# Patient Record
Sex: Female | Born: 1995 | Race: Black or African American | Hispanic: No | Marital: Married | Smoking: Never smoker
Health system: Southern US, Community
[De-identification: ages and names within clinical notes are randomized; demographics above are authoritative.]

## PROBLEM LIST (undated history)

## (undated) ENCOUNTER — Inpatient Hospital Stay (HOSPITAL_COMMUNITY): Payer: Self-pay

## (undated) DIAGNOSIS — K559 Vascular disorder of intestine, unspecified: Secondary | ICD-10-CM

## (undated) DIAGNOSIS — Z789 Other specified health status: Secondary | ICD-10-CM

## (undated) HISTORY — DX: Other specified health status: Z78.9

## (undated) HISTORY — DX: Vascular disorder of intestine, unspecified: K55.9

## (undated) HISTORY — PX: NO PAST SURGERIES: SHX2092

---

## 2015-12-11 NOTE — L&D Delivery Note (Signed)
Delivery Note At 9:10 PM a viable female was delivered via Vaginal, Spontaneous Delivery (Presentation: OA ).  APGAR:8,9 ;  Placenta status: delivered spontaneously intact with fundal massage and mild traction. Cord: 3 vessel, nuchal x1   Anesthesia:  Nubain 10 mg & promethazine 12.5mg  Episiotomy: None Lacerations: None Suture Repair: NA Est. Blood Loss (mL):  200  Mom to postpartum.  Baby to Couplet care / Skin to Skin. Al instruments and gauze accounted for.  Andres Egericia Jalaila Caradonna, D, PGY-1, MPH 07/15/2016, 9:25 PM

## 2016-02-02 LAB — OB RESULTS CONSOLE PLATELET COUNT: PLATELETS: 179 10*3/uL

## 2016-02-02 LAB — OB RESULTS CONSOLE HGB/HCT, BLOOD
HEMATOCRIT: 36 %
HEMOGLOBIN: 11.8 g/dL

## 2016-02-02 LAB — OB RESULTS CONSOLE TSH: TSH: 0.645

## 2016-02-02 LAB — OB RESULTS CONSOLE RUBELLA ANTIBODY, IGM: Rubella: IMMUNE

## 2016-02-02 LAB — OB RESULTS CONSOLE RPR: RPR: NONREACTIVE

## 2016-02-02 LAB — OB RESULTS CONSOLE HEPATITIS B SURFACE ANTIGEN: Hepatitis B Surface Ag: NEGATIVE

## 2016-02-02 LAB — OB RESULTS CONSOLE HIV ANTIBODY (ROUTINE TESTING): HIV: NONREACTIVE

## 2016-03-26 LAB — OB RESULTS CONSOLE HGB/HCT, BLOOD
HEMATOCRIT: 31 %
HEMOGLOBIN: 10.5 g/dL

## 2016-03-26 LAB — OB RESULTS CONSOLE PLATELET COUNT: PLATELETS: 157 10*3/uL

## 2016-03-29 LAB — OB RESULTS CONSOLE ABO/RH: RH Type: POSITIVE

## 2016-05-15 ENCOUNTER — Encounter: Payer: Self-pay | Admitting: *Deleted

## 2016-05-16 ENCOUNTER — Encounter: Payer: Self-pay | Admitting: Obstetrics

## 2016-05-16 ENCOUNTER — Ambulatory Visit (INDEPENDENT_AMBULATORY_CARE_PROVIDER_SITE_OTHER): Payer: Medicaid Other | Admitting: Obstetrics

## 2016-05-16 VITALS — BP 96/60 | HR 85 | Wt 176.0 lb

## 2016-05-16 DIAGNOSIS — K219 Gastro-esophageal reflux disease without esophagitis: Secondary | ICD-10-CM

## 2016-05-16 DIAGNOSIS — Z3493 Encounter for supervision of normal pregnancy, unspecified, third trimester: Secondary | ICD-10-CM | POA: Diagnosis not present

## 2016-05-16 LAB — POCT URINALYSIS DIPSTICK
BILIRUBIN UA: NEGATIVE
Blood, UA: NEGATIVE
GLUCOSE UA: NEGATIVE
KETONES UA: NEGATIVE
Nitrite, UA: NEGATIVE
PROTEIN UA: NEGATIVE
SPEC GRAV UA: 1.01
Urobilinogen, UA: NEGATIVE
pH, UA: 7.5

## 2016-05-16 MED ORDER — VITAFOL FE+ 90-1-200 & 50 MG PO CPPK: 1.0000 | ORAL_CAPSULE | Freq: Every day | ORAL | Status: DC

## 2016-05-16 MED ORDER — OMEPRAZOLE 20 MG PO CPDR: 20.0000 mg | DELAYED_RELEASE_CAPSULE | Freq: Two times a day (BID) | ORAL | Status: DC

## 2016-05-16 NOTE — Progress Notes (Signed)
Subjective:    Faith Woods is being seen today for her first obstetrical visit.  This is a planned pregnancy. She is at 5172w1d gestation. Her obstetrical history is significant for H/O preterm delivery at ~ 28 weeks in [place]. Relationship with FOB: significant other. Patient does intend to breast feed. Pregnancy history fully reviewed.  The information documented in the HPI was reviewed and verified.  Menstrual History: OB History    Gravida Para Term Preterm AB TAB SAB Ectopic Multiple Living   2 1  1      1        Patient's last menstrual period was 10/11/2015 (exact date).    Past Medical History  Diagnosis Date  . Ischemic bowel syndrome (HCC)     No past surgical history on file.   (Not in a hospital admission) Not on File  Social History  Substance Use Topics  . Smoking status: Not on file  . Smokeless tobacco: Not on file  . Alcohol Use: Not on file    No family history on file.   Review of Systems Constitutional: negative for weight loss Gastrointestinal: negative for vomiting Genitourinary:negative for genital lesions and vaginal discharge and dysuria Musculoskeletal:negative for back pain Behavioral/Psych: negative for abusive relationship, depression, illegal drug usage and tobacco use    Objective:    BP 96/60 mmHg  Pulse 85  Wt 176 lb (79.833 kg)  LMP 10/11/2015 (Exact Date) General Appearance:    Alert, cooperative, no distress, appears stated age  Head:    Normocephalic, without obvious abnormality, atraumatic  Eyes:    PERRL, conjunctiva/corneas clear, EOM's intact, fundi    benign, both eyes  Ears:    Normal TM's and external ear canals, both ears  Nose:   Nares normal, septum midline, mucosa normal, no drainage    or sinus tenderness  Throat:   Lips, mucosa, and tongue normal; teeth and gums normal  Neck:   Supple, symmetrical, trachea midline, no adenopathy;    thyroid:  no enlargement/tenderness/nodules; no carotid   bruit or JVD  Back:      Symmetric, no curvature, ROM normal, no CVA tenderness  Lungs:     Clear to auscultation bilaterally, respirations unlabored  Chest Wall:    No tenderness or deformity   Heart:    Regular rate and rhythm, S1 and S2 normal, no murmur, rub   or gallop  Breast Exam:    No tenderness, masses, or nipple abnormality  Abdomen:     Soft, non-tender, bowel sounds active all four quadrants,    no masses, no organomegaly  Genitalia:    Normal female without lesion, discharge or tenderness  Extremities:   Extremities normal, atraumatic, no cyanosis or edema  Pulses:   2+ and symmetric all extremities  Skin:   Skin color, texture, turgor normal, no rashes or lesions  Lymph nodes:   Cervical, supraclavicular, and axillary nodes normal  Neurologic:   CNII-XII intact, normal strength, sensation and reflexes    throughout      Lab Review Urine pregnancy test Labs reviewed no Radiologic studies reviewed no Assessment:    Pregnancy at 5372w1d weeks    H/O Preterm Delivery at ~ 3528 Weeks in [place].   Plan:      Prenatal vitamins.  Counseling provided regarding continued use of seat belts, cessation of alcohol consumption, smoking or use of illicit drugs; infection precautions i.e., influenza/TDAP immunizations, toxoplasmosis,CMV, parvovirus, listeria and varicella; workplace safety, exercise during pregnancy; routine dental care, safe medications,  sexual activity, hot tubs, saunas, pools, travel, caffeine use, fish and methlymercury, potential toxins, hair treatments, varicose veins Weight gain recommendations per IOM guidelines reviewed: underweight/BMI< 18.5--> gain 28 - 40 lbs; normal weight/BMI 18.5 - 24.9--> gain 25 - 35 lbs; overweight/BMI 25 - 29.9--> gain 15 - 25 lbs; obese/BMI >30->gain  11 - 20 lbs Problem list reviewed and updated. FIRST/CF mutation testing/NIPT/QUAD SCREEN/fragile X/Ashkenazi Jewish population testing/Spinal muscular atrophy discussed: too late. Role of ultrasound in pregnancy  discussed; fetal survey: Yes. Amniocentesis discussed: not indicated. VBAC calculator score: VBAC consent form provided Meds ordered this encounter  Medications  . Prenat-FePoly-Metf-FA-DHA-DSS (VITAFOL FE+) 90-1-200 & 50 MG CPPK    Sig: Take 1 tablet by mouth daily before breakfast.    Dispense:  90 each    Refill:  3  . omeprazole (PRILOSEC) 20 MG capsule    Sig: Take 1 capsule (20 mg total) by mouth 2 (two) times daily before a meal.    Dispense:  60 capsule    Refill:  5   Orders Placed This Encounter  Procedures  . [place] M[facility] OB DETAIL +14 WK    Standing Status: Future     Number of Occurrences:      Standing Expiration Date: 07/16/2017    Order Specific Question:  Reason for Exam (SYMPTOM  OR DIAGNOSIS REQUIRED)    Answer:  H/O preterm delivery at 28 weeks.    Order Specific Question:  Preferred imaging location?    Answer:  [facility]Ultrasound  . [place] [facility] OB Transvaginal    Standing Status: Future     Number of Occurrences:      Standing Expiration Date: 07/16/2017    Order Specific Question:  Reason for Exam (SYMPTOM  OR DIAGNOSIS REQUIRED)    Answer:  H/O preterm delivery at 28 weeks.    Order Specific Question:  Preferred imaging location?    Answer:  [facility]Ultrasound  . AMB referral to maternal fetal medicine    Referral Priority:  Routine    Referral Type:  Consultation    Referral Reason:  Specialty Services Required    Number of Visits Requested:  1  . POCT urinalysis dipstick    Follow up in 2 weeks.

## 2016-05-21 ENCOUNTER — Other Ambulatory Visit: Payer: Self-pay | Admitting: Obstetrics

## 2016-05-21 ENCOUNTER — Encounter (HOSPITAL_COMMUNITY): Payer: Self-pay

## 2016-05-21 ENCOUNTER — Ambulatory Visit (HOSPITAL_COMMUNITY)
Admission: RE | Admit: 2016-05-21 | Discharge: 2016-05-21 | Disposition: A | Discharge status: Home / Self Care | Payer: Medicaid Other | Source: Ambulatory Visit | Attending: Obstetrics | Admitting: Obstetrics

## 2016-05-21 DIAGNOSIS — O09893 Supervision of other high risk pregnancies, third trimester: Secondary | ICD-10-CM

## 2016-05-21 DIAGNOSIS — O09213 Supervision of pregnancy with history of pre-term labor, third trimester: Secondary | ICD-10-CM | POA: Diagnosis not present

## 2016-05-21 DIAGNOSIS — Z3493 Encounter for supervision of normal pregnancy, unspecified, third trimester: Secondary | ICD-10-CM

## 2016-05-21 DIAGNOSIS — Z3689 Encounter for other specified antenatal screening: Secondary | ICD-10-CM

## 2016-05-21 DIAGNOSIS — Z3A31 31 weeks gestation of pregnancy: Secondary | ICD-10-CM

## 2016-05-21 DIAGNOSIS — Z36 Encounter for antenatal screening of mother: Secondary | ICD-10-CM | POA: Diagnosis not present

## 2016-05-21 LAB — NUSWAB VG+, CANDIDA 6SP
Atopobium vaginae: HIGH Score — AB
CANDIDA KRUSEI, NAA: NEGATIVE
CHLAMYDIA TRACHOMATIS, NAA: NEGATIVE
Candida albicans, NAA: NEGATIVE
Candida glabrata, NAA: NEGATIVE
Candida lusitaniae, NAA: NEGATIVE
Candida parapsilosis, NAA: NEGATIVE
Candida tropicalis, NAA: NEGATIVE
MEGASPHAERA 1: HIGH {score} — AB
NEISSERIA GONORRHOEAE, NAA: NEGATIVE
TRICH VAG BY NAA: NEGATIVE

## 2016-05-22 ENCOUNTER — Other Ambulatory Visit: Payer: Self-pay | Admitting: Obstetrics

## 2016-05-22 DIAGNOSIS — B9689 Other specified bacterial agents as the cause of diseases classified elsewhere: Secondary | ICD-10-CM

## 2016-05-22 DIAGNOSIS — N760 Acute vaginitis: Principal | ICD-10-CM

## 2016-05-22 MED ORDER — METRONIDAZOLE 500 MG PO TABS: 500.0000 mg | ORAL_TABLET | Freq: Two times a day (BID) | ORAL | Status: DC

## 2016-05-24 ENCOUNTER — Encounter: Payer: Self-pay | Admitting: *Deleted

## 2016-05-25 ENCOUNTER — Encounter (HOSPITAL_COMMUNITY): Payer: Self-pay

## 2016-05-25 ENCOUNTER — Ambulatory Visit (HOSPITAL_COMMUNITY)
Admission: RE | Admit: 2016-05-25 | Discharge: 2016-05-25 | Disposition: A | Discharge status: Home / Self Care | Payer: Medicaid Other | Source: Ambulatory Visit | Attending: Obstetrics | Admitting: Obstetrics

## 2016-05-25 VITALS — BP 110/69 | HR 89 | Wt 178.8 lb

## 2016-05-25 DIAGNOSIS — O09213 Supervision of pregnancy with history of pre-term labor, third trimester: Secondary | ICD-10-CM | POA: Diagnosis present

## 2016-05-25 DIAGNOSIS — Z3A32 32 weeks gestation of pregnancy: Secondary | ICD-10-CM | POA: Diagnosis not present

## 2016-05-25 DIAGNOSIS — O09293 Supervision of pregnancy with other poor reproductive or obstetric history, third trimester: Secondary | ICD-10-CM

## 2016-05-25 NOTE — Consult Note (Signed)
MFM Consult, staff Note  I reviewed with the patient the pathophysiology of preterm delivery in pregnancy and that often the etiology of the preterm delivery is not ascertained from the clinical scenario. In this patient's case she has a history of 28 week pPROM followed by spontaneous preterm delivery.  ? Infection may be a cause of preterm labor and preterm delivery. Advanced cervical dilation can also lead to infection; therefore, there are 2 mechanisms that could be contributing to the preterm delivery. Nonetheless the recurrence for spontaneous preterm birth is 2 times higher than the general population in women who experience a prior preterm delivery and the actual risk can be in the range of 50% to 60%.  ? The risk of preterm delivery increases with each prior preterm birth. The most recent birth is the most predictive of risk for future preterm birth, which was demonstrated in the [place] study by Kerr-McGeeBakketeig et al in 1981. The risk increases further as the gestational age of the index preterm birth declines, especially with gestational age before 8032 weeks. If the 1st preterm birth occurred before 32 weeks the risk of recurrent preterm birth is 28%. The etiology of recurrent preterm delivery is often not clear but has been related to shortened cervix, infection and short inter-pregnancy interval.   Given that she is already [redacted] weeks gestation, the classic preventive and surveillance measures are not effective (eg, cervical length assessments prior to 28 weeks and starting weekly 17-P from 16-36 weeks).  I focused on giving her preterm labor precautions and advised her to seek early care in the first trimester for next pregnancy.  I did remind the patient that there is no treatment for preterm delivery, but there are ways to follow the next pregnancy in order to make appropriate interventions. The cervical length should be measured via transvaginal ultrasound at the anatomy scan and again at 22-23  6/7 weeks for risk assessment.  Follow up cervical length thereafter as clinically appropriate.  If at any point the cervix measures less than 25 mm, we would then recommend supplemental vaginal progesterone and more close surveillance depending on actual length. Again, this would be a discussion that would need to be undertaken at that time depending on the clinical scenario.  ? I also advised the patient undergo weekly intramuscular injections of 17-hydroxyprogesterone caproate (17-OHP) from [redacted] wks GA until 36 weeks' gestational age in the next pregnancy noting at 6758w3d she is too late for appreciable benefit in this gestation. Nonetheless, this is a once per week intramuscular injection which is supported by compelling scientific evidence to decrease the risk of recurrent preterm delivery in women with a prior history of preterm delivery. I did remind the patient that this is not a treatment for preterm delivery, but rather an intervention that may significantly reduce the recurrence of preterm delivery, especially early pPROM history as with this patient.   Summary of Recommendations:  1. Preterm labor precautions 2.  Endovaginal cervical length screening measurement at 18weeks and 22-23 6/7 weeks for risk assessment in future gestation.  3. For next pregnancy, I also advised the patient undergo weekly intramuscular injections of 17-hydroxyprogesterone caproate (17-OHP) from [redacted] wks GA until 36 weeks' gestational age.  Time Spent: I spent in excess of 30 minutes in consultation with this patient to review records, evaluate her case, and provide her with an adequate discussion and education. More than 50% of this time was spent in direct face-to-face counseling. ? It was a pleasure seeing your patient  in the office today. Thank you for consultation. Please do not hesitate to contact our service for any further questions.  ? Thank you, ? Louann Sjogren Denney  ?

## 2016-05-30 ENCOUNTER — Ambulatory Visit (INDEPENDENT_AMBULATORY_CARE_PROVIDER_SITE_OTHER): Payer: Medicaid Other | Admitting: Obstetrics

## 2016-05-30 VITALS — BP 99/59 | HR 96 | Temp 98.8°F | Wt 180.0 lb

## 2016-05-30 DIAGNOSIS — Z3493 Encounter for supervision of normal pregnancy, unspecified, third trimester: Secondary | ICD-10-CM

## 2016-05-30 LAB — POCT URINALYSIS DIPSTICK
BILIRUBIN UA: NEGATIVE
GLUCOSE UA: NEGATIVE
Ketones, UA: NEGATIVE
NITRITE UA: NEGATIVE
RBC UA: NEGATIVE
Spec Grav, UA: 1.01
Urobilinogen, UA: NEGATIVE
pH, UA: 7

## 2016-05-31 ENCOUNTER — Encounter: Payer: Self-pay | Admitting: Obstetrics

## 2016-05-31 NOTE — Progress Notes (Signed)
Subjective:    Faith Woods is a 20 y.o. female being seen today for her obstetrical visit. She is at 6927w2d gestation. Patient reports no complaints. Fetal movement: normal.  Problem List Items Addressed This Visit    None    Visit Diagnoses    Prenatal care, third trimester    -  Primary    Relevant Orders    POCT urinalysis dipstick (Completed)      There are no active problems to display for this patient.  Objective:    BP 99/59 mmHg  Pulse 96  Temp(Src) 98.8 F (37.1 C)  Wt 180 lb (81.647 kg)  LMP 10/11/2015 (Exact Date) FHT:  150 BPM  Uterine Size: size equals dates  Presentation: unsure     Assessment:    Pregnancy @ 2327w2d weeks   Plan:     labs reviewed, problem list updated Consent signed. GBS sent TDAP offered  Rhogam given for RH negative Pediatrician: discussed. Infant feeding: plans to breastfeed. Maternity leave: discussed. Cigarette smoking: never smoked. Orders Placed This Encounter  Procedures  . POCT urinalysis dipstick   No orders of the defined types were placed in this encounter.   Follow up in 2 Weeks.

## 2016-06-06 ENCOUNTER — Ambulatory Visit (INDEPENDENT_AMBULATORY_CARE_PROVIDER_SITE_OTHER): Payer: Medicaid Other | Admitting: Obstetrics

## 2016-06-06 VITALS — BP 103/68 | HR 81 | Wt 178.0 lb

## 2016-06-06 DIAGNOSIS — Z3483 Encounter for supervision of other normal pregnancy, third trimester: Secondary | ICD-10-CM

## 2016-06-06 LAB — POCT URINALYSIS DIPSTICK
BILIRUBIN UA: NEGATIVE
Glucose, UA: NEGATIVE
Ketones, UA: NEGATIVE
LEUKOCYTES UA: NEGATIVE
NITRITE UA: NEGATIVE
PH UA: 7
PROTEIN UA: NEGATIVE
RBC UA: NEGATIVE
Spec Grav, UA: <=1.005
UROBILINOGEN UA: NEGATIVE

## 2016-06-07 ENCOUNTER — Encounter: Payer: Self-pay | Admitting: Obstetrics

## 2016-06-07 NOTE — Progress Notes (Signed)
Subjective:    Faith PulsRhoda Woods is a 20 y.o. female being seen today for her obstetrical visit. She is at 1553w2d gestation. Patient reports no complaints. Fetal movement: normal.  Problem List Items Addressed This Visit    None    Visit Diagnoses    Encounter for supervision of other normal pregnancy in third trimester    -  Primary    Relevant Orders    POCT urinalysis dipstick (Completed)      There are no active problems to display for this patient.  Objective:    BP 103/68 mmHg  Pulse 81  Wt 178 lb (80.74 kg)  LMP 10/11/2015 (Exact Date) FHT:  150 BPM  Uterine Size: size equals dates  Presentation: unsure     Assessment:    Pregnancy @ 1753w2d weeks   Plan:     labs reviewed, problem list updated Consent signed. GBS sent TDAP offered  Rhogam given for RH negative Pediatrician: discussed. Infant feeding: plans to breastfeed. Maternity leave: discussed. Cigarette smoking: never smoked. Orders Placed This Encounter  Procedures  . POCT urinalysis dipstick   No orders of the defined types were placed in this encounter.   Follow up in 1 Week.

## 2016-06-08 ENCOUNTER — Other Ambulatory Visit: Payer: Self-pay | Admitting: *Deleted

## 2016-06-08 DIAGNOSIS — N760 Acute vaginitis: Principal | ICD-10-CM

## 2016-06-08 DIAGNOSIS — B9689 Other specified bacterial agents as the cause of diseases classified elsewhere: Secondary | ICD-10-CM

## 2016-06-08 MED ORDER — METRONIDAZOLE 500 MG PO TABS: 500.0000 mg | ORAL_TABLET | Freq: Two times a day (BID) | ORAL | Status: DC

## 2016-06-08 NOTE — Progress Notes (Signed)
Pt husband called to office stating Dr Clearance CootsHarper was to sent a Rx for bacterial infection at last visit.  It appears Flagyl was ordered beginning of June but was printed and not escribed. Pt never received medication. Flagyl was reordered today.

## 2016-06-13 ENCOUNTER — Ambulatory Visit (INDEPENDENT_AMBULATORY_CARE_PROVIDER_SITE_OTHER): Payer: Medicaid Other | Admitting: Certified Nurse Midwife

## 2016-06-13 VITALS — BP 104/67 | HR 89 | Wt 178.0 lb

## 2016-06-13 DIAGNOSIS — O0993 Supervision of high risk pregnancy, unspecified, third trimester: Secondary | ICD-10-CM

## 2016-06-13 DIAGNOSIS — N760 Acute vaginitis: Secondary | ICD-10-CM

## 2016-06-13 DIAGNOSIS — R51 Headache: Secondary | ICD-10-CM

## 2016-06-13 DIAGNOSIS — A499 Bacterial infection, unspecified: Secondary | ICD-10-CM

## 2016-06-13 DIAGNOSIS — O26893 Other specified pregnancy related conditions, third trimester: Secondary | ICD-10-CM

## 2016-06-13 DIAGNOSIS — B9689 Other specified bacterial agents as the cause of diseases classified elsewhere: Secondary | ICD-10-CM

## 2016-06-13 LAB — POCT URINALYSIS DIPSTICK
Bilirubin, UA: NEGATIVE
Glucose, UA: NEGATIVE
Ketones, UA: NEGATIVE
NITRITE UA: NEGATIVE
PH UA: 8
Spec Grav, UA: 1.01
UROBILINOGEN UA: NEGATIVE

## 2016-06-13 MED ORDER — BUTALBITAL-APAP-CAFFEINE 50-325-40 MG PO TABS: 1.0000 | ORAL_TABLET | Freq: Four times a day (QID) | ORAL | Status: DC | PRN

## 2016-06-13 MED ORDER — METRONIDAZOLE 500 MG PO TABS: 500.0000 mg | ORAL_TABLET | Freq: Two times a day (BID) | ORAL | Status: DC

## 2016-06-13 NOTE — Addendum Note (Signed)
Addended by: Marya LandryFOSTER, SUZANNE D on: 06/13/2016 03:06 PM   Modules accepted: Orders

## 2016-06-13 NOTE — Addendum Note (Signed)
Addended by: Marya LandryFOSTER, Keziah Drotar D on: 06/13/2016 05:27 PM   Modules accepted: Orders

## 2016-06-13 NOTE — Progress Notes (Signed)
Subjective:    Faith Woods is a 20 y.o. female being seen today for her obstetrical visit. She is at 2517w1d gestation. Patient reports headache, no bleeding, no contractions, no cramping and no leaking. Fetal movement: normal.  Problem List Items Addressed This Visit    None    Visit Diagnoses    BV (bacterial vaginosis)    -  Primary    Relevant Medications    metroNIDAZOLE (FLAGYL) 500 MG tablet      There are no active problems to display for this patient.  Objective:    BP 104/67 mmHg  Pulse 89  Wt 178 lb (80.74 kg)  LMP 10/11/2015 (Exact Date) FHT:  135 BPM  Uterine Size: size equals dates  Presentation: cephalic   cervix: long, thick, closed and posterior  Assessment:    Pregnancy @ 3017w1d weeks   High risk for depression  HA in pregnancy  Plan:    Declines antidepressants today   labs reviewed, problem list updated Consent signed. GBS sent TDAP offered  Rhogam given for RH negative Pediatrician: discussed. Infant feeding: plans to breastfeed. Maternity leave: discussed. Cigarette smoking: never smoked. No orders of the defined types were placed in this encounter.   Meds ordered this encounter  Medications  . metroNIDAZOLE (FLAGYL) 500 MG tablet    Sig: Take 1 tablet (500 mg total) by mouth 2 (two) times daily.    Dispense:  14 tablet    Refill:  0   Follow up in 1 Week.

## 2016-06-15 LAB — STREP GP B NAA: STREP GROUP B AG: NEGATIVE

## 2016-06-17 LAB — URINE CULTURE, OB REFLEX

## 2016-06-17 LAB — CULTURE, OB URINE

## 2016-06-19 ENCOUNTER — Other Ambulatory Visit: Payer: Self-pay | Admitting: Certified Nurse Midwife

## 2016-06-19 DIAGNOSIS — O2343 Unspecified infection of urinary tract in pregnancy, third trimester: Secondary | ICD-10-CM

## 2016-06-19 MED ORDER — NITROFURANTOIN MONOHYD MACRO 100 MG PO CAPS: 100.0000 mg | ORAL_CAPSULE | Freq: Two times a day (BID) | ORAL | Status: DC

## 2016-06-20 ENCOUNTER — Ambulatory Visit (INDEPENDENT_AMBULATORY_CARE_PROVIDER_SITE_OTHER): Payer: Medicaid Other | Admitting: Certified Nurse Midwife

## 2016-06-20 ENCOUNTER — Telehealth: Payer: Self-pay | Admitting: *Deleted

## 2016-06-20 VITALS — BP 93/57 | HR 82 | Temp 98.3°F | Wt 181.0 lb

## 2016-06-20 DIAGNOSIS — Z3493 Encounter for supervision of normal pregnancy, unspecified, third trimester: Secondary | ICD-10-CM

## 2016-06-20 DIAGNOSIS — Z3483 Encounter for supervision of other normal pregnancy, third trimester: Secondary | ICD-10-CM

## 2016-06-20 LAB — POCT URINALYSIS DIPSTICK
BILIRUBIN UA: NEGATIVE
Blood, UA: NEGATIVE
Glucose, UA: NEGATIVE
KETONES UA: NEGATIVE
LEUKOCYTES UA: NEGATIVE
NITRITE UA: NEGATIVE
PH UA: 8
Protein, UA: NEGATIVE
Spec Grav, UA: 1.01
Urobilinogen, UA: NEGATIVE

## 2016-06-20 NOTE — Progress Notes (Signed)
Patient reports some back pain- ? UTI related- she is going to get her antibiotic today.

## 2016-06-20 NOTE — Assessment & Plan Note (Signed)
  Clinic  Prenatal Labs  Dating  Blood type: O/Positive/-- (04/20 0000)   Genetic Screen 1 Screen:    AFP:     Quad:     NIPS: Antibody:   Anatomic US  Rubella: Immune (02/23 0000)  GTT Early:               Third trimester:  RPR: Nonreactive (02/23 0000)   Flu vaccine  HBsAg: Negative (02/23 0000)   TDaP vaccine                                               Rhogam: N/A HIV: Non-reactive (02/23 0000)   Baby Food                             Breast                  WJX:[id number]:Negative (07/05 1533)(For PCN allergy, check sensitivities)  Contraception  POP Pap:  Circumcision  N/A   Pediatrician    Support Person

## 2016-06-20 NOTE — Progress Notes (Signed)
Subjective:    Faith PulsRhoda Woods is a 20 y.o. female being seen today for her obstetrical visit. She is at 1871w1d gestation. Patient reports no complaints. Fetal movement: normal.  Problem List Items Addressed This Visit    None    Visit Diagnoses    Prenatal care, third trimester    -  Primary    Relevant Orders    POCT urinalysis dipstick (Completed)      There are no active problems to display for this patient.  Objective:    BP 93/57 mmHg  Pulse 82  Temp(Src) 98.3 F (36.8 C)  Wt 181 lb (82.101 kg)  LMP 10/11/2015 (Exact Date) FHT:  140 BPM  Uterine Size: size equals dates  Presentation: cephalic     Assessment:    Pregnancy @ 5271w1d weeks   doing well Plan:     labs reviewed, problem list updated Consent signed. GBS results reviewed TDAP offered  Rhogam given for RH negative Pediatrician: discussed. Infant feeding: plans to breastfeed. Maternity leave: n/a. Cigarette smoking: never smoked. Orders Placed This Encounter  Procedures  . POCT urinalysis dipstick   No orders of the defined types were placed in this encounter.   Follow up in 1 Week.

## 2016-06-21 NOTE — Telephone Encounter (Signed)
See telephone note for this encounter.

## 2016-06-29 ENCOUNTER — Ambulatory Visit (INDEPENDENT_AMBULATORY_CARE_PROVIDER_SITE_OTHER): Payer: Medicaid Other | Admitting: Certified Nurse Midwife

## 2016-06-29 VITALS — BP 108/65 | HR 91 | Temp 98.7°F | Wt 183.0 lb

## 2016-06-29 DIAGNOSIS — Z3483 Encounter for supervision of other normal pregnancy, third trimester: Secondary | ICD-10-CM

## 2016-06-29 DIAGNOSIS — O2343 Unspecified infection of urinary tract in pregnancy, third trimester: Secondary | ICD-10-CM

## 2016-06-29 LAB — POCT URINALYSIS DIPSTICK
Bilirubin, UA: NEGATIVE
Blood, UA: 50
GLUCOSE UA: NEGATIVE
KETONES UA: NEGATIVE
Nitrite, UA: NEGATIVE
Spec Grav, UA: 1.02
Urobilinogen, UA: NEGATIVE
pH, UA: 5

## 2016-06-29 MED ORDER — NITROFURANTOIN MONOHYD MACRO 100 MG PO CAPS: 100.0000 mg | ORAL_CAPSULE | Freq: Two times a day (BID) | ORAL | Status: AC

## 2016-06-29 NOTE — Progress Notes (Signed)
Subjective:    Faith Woods is a 20 y.o. female being seen today for her obstetrical visit. She is at 4579w3d gestation. Patient reports backache, no bleeding, no leaking and occasional contractions. Fetal movement: normal.  Problem List Items Addressed This Visit      Other   Encounter for supervision of other normal pregnancy in third trimester     Clinic  [facility] Prenatal Labs  Dating  LMP Blood type: O/Positive/-- (04/20 0000)   Genetic Screen 1 Screen:    AFP:     Quad:     NIPS: Antibody:   Anatomic US  Rubella: Immune (02/23 0000)  GTT Early:               Third trimester:  RPR: Nonreactive (02/23 0000)   Flu vaccine  HBsAg: Negative (02/23 0000)   TDaP vaccine                                               Rhogam: N/A HIV: Non-reactive (02/23 0000)   Baby Food                               Breast                WUJ:[id number]:Negative (07/05 1533)(For PCN allergy, check sensitivities)  Contraception   Pap:  Circumcision  N/A   Pediatrician  [facility] [place]   Support Person  Spouse: Faith Woods [facility] Surgery[facility]YEKUM          Relevant Orders   POCT urinalysis dipstick (Completed)    Other Visit Diagnoses    UTI (urinary tract infection) during pregnancy, third trimester    -  Primary    Relevant Medications    nitrofurantoin, macrocrystal-monohydrate, (MACROBID) 100 MG capsule      Patient Active Problem List   Diagnosis Date Noted  . Encounter for supervision of other normal pregnancy in third trimester 06/20/2016    Objective:    BP 108/65 mmHg  Pulse 91  Temp(Src) 98.7 F (37.1 C)  Wt 183 lb (83.008 kg)  LMP 10/11/2015 (Exact Date) FHT: 140 BPM  Uterine Size: size equals dates  Presentations: cephalic  Pelvic Exam:              Dilation: 0.5 cm       Effacement: Long             Station:  -3    Consistency: medium            Position: posterior     Assessment:    Pregnancy @ 2579w3d weeks    Plan:   Plans for delivery: Vaginal anticipated; labs reviewed; problem  list updated Counseling: Consent signed. Infant feeding: plans to breastfeed. Cigarette smoking: never smoked. L&D discussion: symptoms of labor, discussed when to call, discussed what number to call, anesthetic/analgesic options reviewed and delivering clinician:  plans no preference. Postpartum supports and preparation: circumcision discussed and contraception plans discussed.  Follow up in 1 Week.

## 2016-06-29 NOTE — Assessment & Plan Note (Signed)
  Clinic  [facility] Prenatal Labs  Dating  LMP Blood type: O/Positive/-- (04/20 0000)   Genetic Screen 1 Screen:    AFP:     Quad:     NIPS: Antibody:   Anatomic US  Rubella: Immune (02/23 0000)  GTT Early:               Third trimester:  RPR: Nonreactive (02/23 0000)   Flu vaccine  HBsAg: Negative (02/23 0000)   TDaP vaccine                                               Rhogam: N/A HIV: Non-reactive (02/23 0000)   Baby Food                               Breast                UYQ:[id number]:Negative (07/05 1533)(For PCN allergy, check sensitivities)  Contraception   Pap:  Circumcision  N/A   Pediatrician  [facility] [place]   Support Person  Spouse: Bailey MechGabriel ADJEI CuLPe[facility] Surgery Center LLCGYEKUM

## 2016-07-03 ENCOUNTER — Telehealth: Payer: Self-pay | Admitting: *Deleted

## 2016-07-03 NOTE — Telephone Encounter (Signed)
Request call back 5:47 Call to patient's partner- he states the patient has sinus problem and does not feel well. She is feverish- but has not taken temperature. Patient also has headache and her body is sore. She is not having contractions or change in discharge. She is not allergic to anything. Told him I would check with Rachelle for advisement and call him back.  Attempted to call patient and got no answer.

## 2016-07-04 ENCOUNTER — Other Ambulatory Visit: Payer: Self-pay | Admitting: Certified Nurse Midwife

## 2016-07-04 ENCOUNTER — Ambulatory Visit (INDEPENDENT_AMBULATORY_CARE_PROVIDER_SITE_OTHER): Payer: Medicaid Other | Admitting: Certified Nurse Midwife

## 2016-07-04 VITALS — BP 102/68 | HR 106 | Temp 98.2°F | Wt 180.0 lb

## 2016-07-04 DIAGNOSIS — R6889 Other general symptoms and signs: Secondary | ICD-10-CM

## 2016-07-04 DIAGNOSIS — J069 Acute upper respiratory infection, unspecified: Secondary | ICD-10-CM

## 2016-07-04 DIAGNOSIS — Z3483 Encounter for supervision of other normal pregnancy, third trimester: Secondary | ICD-10-CM

## 2016-07-04 LAB — POCT URINALYSIS DIPSTICK
Bilirubin, UA: NEGATIVE
Blood, UA: 50
GLUCOSE UA: NEGATIVE
KETONES UA: NEGATIVE
Leukocytes, UA: NEGATIVE
Nitrite, UA: NEGATIVE
SPEC GRAV UA: 1.015
Urobilinogen, UA: NEGATIVE
pH, UA: 6

## 2016-07-04 LAB — INFLUENZA A AND B
Influenza A Ag, EIA: NEGATIVE
Influenza B Ag, EIA: NEGATIVE

## 2016-07-04 LAB — PLEASE NOTE:

## 2016-07-04 MED ORDER — AMOXICILLIN-POT CLAVULANATE 875-125 MG PO TABS: 1.0000 | ORAL_TABLET | Freq: Two times a day (BID) | ORAL | 0 refills | Status: AC

## 2016-07-04 MED ORDER — OSELTAMIVIR PHOSPHATE 45 MG PO CAPS
45.0000 mg | ORAL_CAPSULE | Freq: Two times a day (BID) | ORAL | 0 refills | Status: DC
Start: 2016-07-04 — End: 2016-07-15

## 2016-07-04 MED ORDER — PSEUDOEPH-HYDROCODONE-GG 30-2.5-200 MG/5ML PO SOLN: 10.0000 mL | Freq: Three times a day (TID) | ORAL | 0 refills | Status: DC | PRN

## 2016-07-04 NOTE — Progress Notes (Signed)
Subjective:    Faith Woods is a 20 y.o. female being seen today for her obstetrical visit. She is at [redacted]w[redacted]d gestation. Patient reports backache, headache, nausea, no bleeding, no cramping, no leaking and occasional contractions. Fetal movement: normal.  Here for flu like symptoms.  Reports that her spouse is sick with the Flu?.  Reports chills and flu like symptoms with shooting pains down her shins bilaterally.  Reports HA. Flu like symptoms since Sunday, has not had any treatment.     Problem List Items Addressed This Visit      Other   Encounter for supervision of other normal pregnancy in third trimester - Primary   Relevant Orders   POCT urinalysis dipstick (Completed)   Culture, OB Urine    Other Visit Diagnoses    Flu-like symptoms       Relevant Medications   Pseudoeph-Hydrocodone-GG (HYCOFENIX) 30-2.5-200 MG/5ML SOLN   oseltamivir (TAMIFLU) 45 MG capsule   URI (upper respiratory infection)       Relevant Medications   oseltamivir (TAMIFLU) 45 MG capsule   amoxicillin-clavulanate (AUGMENTIN) 875-125 MG tablet     Patient Active Problem List   Diagnosis Date Noted  . Encounter for supervision of other normal pregnancy in third trimester 06/20/2016    Objective:    BP 102/68   Pulse (!) 106   Temp 98.2 F (36.8 C)   Wt 180 lb (81.6 kg)   LMP 10/11/2015 (Exact Date)  FHT: 150 BPM  Uterine Size: size equals dates  Presentations: cephalic  Pelvic Exam: deferred     Assessment:    Pregnancy @ [redacted]w[redacted]d weeks   URI vs Flu   Plan:   Plans for delivery: Vaginal anticipated; labs reviewed; problem list updated Counseling: Consent signed. Infant feeding: plans to breastfeed. Cigarette smoking: never smoked. L&D discussion: symptoms of labor, discussed when to call, discussed what number to call, anesthetic/analgesic options reviewed and delivering clinician:  plans no preference. Postpartum supports and preparation: circumcision discussed and contraception plans  discussed.  Follow up in 1 Week.

## 2016-07-05 ENCOUNTER — Telehealth: Payer: Self-pay | Admitting: *Deleted

## 2016-07-07 LAB — URINE CULTURE, OB REFLEX

## 2016-07-07 LAB — CULTURE, OB URINE

## 2016-07-10 ENCOUNTER — Other Ambulatory Visit: Payer: Self-pay | Admitting: Certified Nurse Midwife

## 2016-07-10 ENCOUNTER — Ambulatory Visit (INDEPENDENT_AMBULATORY_CARE_PROVIDER_SITE_OTHER): Payer: Medicaid Other | Admitting: Certified Nurse Midwife

## 2016-07-10 VITALS — BP 100/65 | HR 88 | Temp 98.9°F | Wt 182.8 lb

## 2016-07-10 DIAGNOSIS — Z3493 Encounter for supervision of normal pregnancy, unspecified, third trimester: Secondary | ICD-10-CM | POA: Diagnosis not present

## 2016-07-10 LAB — POCT URINALYSIS DIPSTICK
BILIRUBIN UA: NEGATIVE
GLUCOSE UA: NEGATIVE
KETONES UA: NEGATIVE
LEUKOCYTES UA: NEGATIVE
NITRITE UA: NEGATIVE
PH UA: 5
Spec Grav, UA: 1.015
Urobilinogen, UA: 0.2

## 2016-07-10 NOTE — Progress Notes (Signed)
Subjective:    Faith Woods is a 20 y.o. female being seen today for her obstetrical visit. She is at [redacted]w[redacted]d gestation. Patient reports no complaints. Fetal movement: normal.  Problem List Items Addressed This Visit    None    Visit Diagnoses    Prenatal care, third trimester    -  Primary   Relevant Orders   POCT Urinalysis Dipstick (Completed)     Patient Active Problem List   Diagnosis Date Noted  . Encounter for supervision of other normal pregnancy in third trimester 06/20/2016    Objective:    BP 100/65   Pulse 88   Temp 98.9 F (37.2 C)   Wt 182 lb 12.8 oz (82.9 kg)   LMP 10/11/2015 (Exact Date)  FHT: 135 BPM  Uterine Size: 40 cm and size equals dates  Presentations: cephalic  Pelvic Exam:              Dilation: 1cm       Effacement: Long             Station:  -3    Consistency: medium            Position: posterior     Assessment:    Pregnancy @ [redacted]w[redacted]d weeks   Plan:   Plans for delivery: Vaginal anticipated; labs reviewed; problem list updated Counseling: Consent signed. Infant feeding: plans to breastfeed. Cigarette smoking: never smoked. L&D discussion: symptoms of labor, discussed when to call, discussed what number to call, anesthetic/analgesic options reviewed and delivering clinician:  plans Certified Nurse-Midwife. Postpartum supports and preparation: circumcision discussed and contraception plans discussed.  Follow up in 1 Week with NST.

## 2016-07-10 NOTE — Progress Notes (Signed)
Pt c/o low back pain and pressure

## 2016-07-15 ENCOUNTER — Inpatient Hospital Stay (HOSPITAL_COMMUNITY)
Admission: AD | Admit: 2016-07-15 | Discharge: 2016-07-17 | DRG: 775 | Disposition: A | Discharge status: Home / Self Care | Payer: Medicaid Other | Source: Ambulatory Visit | Attending: Obstetrics & Gynecology | Admitting: Obstetrics & Gynecology

## 2016-07-15 ENCOUNTER — Encounter (HOSPITAL_COMMUNITY): Payer: Self-pay

## 2016-07-15 ENCOUNTER — Inpatient Hospital Stay (HOSPITAL_COMMUNITY)
Admission: AD | Admit: 2016-07-15 | Discharge: 2016-07-15 | Disposition: A | Discharge status: Home / Self Care | Payer: Medicaid Other | Source: Ambulatory Visit | Attending: Obstetrics & Gynecology | Admitting: Obstetrics & Gynecology

## 2016-07-15 ENCOUNTER — Encounter (HOSPITAL_COMMUNITY): Payer: Self-pay | Admitting: *Deleted

## 2016-07-15 DIAGNOSIS — IMO0001 Reserved for inherently not codable concepts without codable children: Secondary | ICD-10-CM

## 2016-07-15 DIAGNOSIS — Z3483 Encounter for supervision of other normal pregnancy, third trimester: Secondary | ICD-10-CM | POA: Diagnosis present

## 2016-07-15 DIAGNOSIS — Z3A39 39 weeks gestation of pregnancy: Secondary | ICD-10-CM

## 2016-07-15 LAB — CBC
HCT: 34.6 % — ABNORMAL LOW (ref 36.0–46.0)
Hemoglobin: 11.7 g/dL — ABNORMAL LOW (ref 12.0–15.0)
MCH: 28.8 pg (ref 26.0–34.0)
MCHC: 33.8 g/dL (ref 30.0–36.0)
MCV: 85.2 fL (ref 78.0–100.0)
Platelets: 169 10*3/uL (ref 150–400)
RBC: 4.06 MIL/uL (ref 3.87–5.11)
RDW: 12.6 % (ref 11.5–15.5)
WBC: 11.6 10*3/uL — ABNORMAL HIGH (ref 4.0–10.5)

## 2016-07-15 LAB — ABO/RH: ABO/RH(D): O POS

## 2016-07-15 LAB — TYPE AND SCREEN
ABO/RH(D): O POS
ANTIBODY SCREEN: NEGATIVE

## 2016-07-15 MED ORDER — ACETAMINOPHEN 325 MG PO TABS: 650.0000 mg | ORAL_TABLET | ORAL | Status: DC | PRN

## 2016-07-15 MED ORDER — WITCH HAZEL-GLYCERIN EX PADS: 1.0000 "application " | MEDICATED_PAD | CUTANEOUS | Status: DC | PRN

## 2016-07-15 MED ORDER — EPHEDRINE 5 MG/ML INJ
10.0000 mg | INTRAVENOUS | Status: DC | PRN
  Filled 2016-07-15: qty 4

## 2016-07-15 MED ORDER — SIMETHICONE 80 MG PO CHEW: 80.0000 mg | CHEWABLE_TABLET | ORAL | Status: DC | PRN

## 2016-07-15 MED ORDER — LACTATED RINGERS IV SOLN: 500.0000 mL | INTRAVENOUS | Status: DC | PRN

## 2016-07-15 MED ORDER — OXYTOCIN 40 UNITS IN LACTATED RINGERS INFUSION - SIMPLE MED
2.5000 [IU]/h | INTRAVENOUS | Status: DC
  Filled 2016-07-15: qty 1000

## 2016-07-15 MED ORDER — SOD CITRATE-CITRIC ACID 500-334 MG/5ML PO SOLN: 30.0000 mL | ORAL | Status: DC | PRN

## 2016-07-15 MED ORDER — NALBUPHINE HCL 10 MG/ML IJ SOLN
10.0000 mg | INTRAMUSCULAR | Status: DC
  Administered 2016-07-15: 10 mg via INTRAVENOUS
  Filled 2016-07-15: qty 1

## 2016-07-15 MED ORDER — FENTANYL 2.5 MCG/ML BUPIVACAINE 1/10 % EPIDURAL INFUSION (WH - ANES): 14.0000 mL/h | INTRAMUSCULAR | Status: DC | PRN

## 2016-07-15 MED ORDER — LACTATED RINGERS IV SOLN: 500.0000 mL | Freq: Once | INTRAVENOUS | Status: DC

## 2016-07-15 MED ORDER — FLEET ENEMA 7-19 GM/118ML RE ENEM: 1.0000 | ENEMA | RECTAL | Status: DC | PRN

## 2016-07-15 MED ORDER — OXYCODONE-ACETAMINOPHEN 5-325 MG PO TABS: 1.0000 | ORAL_TABLET | ORAL | Status: DC | PRN

## 2016-07-15 MED ORDER — TETANUS-DIPHTH-ACELL PERTUSSIS 5-2.5-18.5 LF-MCG/0.5 IM SUSP
0.5000 mL | Freq: Once | INTRAMUSCULAR | Status: AC
  Administered 2016-07-17: 0.5 mL via INTRAMUSCULAR
  Filled 2016-07-15: qty 0.5

## 2016-07-15 MED ORDER — LACTATED RINGERS IV SOLN
INTRAVENOUS | Status: DC
  Administered 2016-07-15: 17:00:00 via INTRAVENOUS

## 2016-07-15 MED ORDER — ZOLPIDEM TARTRATE 5 MG PO TABS: 5.0000 mg | ORAL_TABLET | Freq: Every evening | ORAL | Status: DC | PRN

## 2016-07-15 MED ORDER — IBUPROFEN 600 MG PO TABS
600.0000 mg | ORAL_TABLET | Freq: Four times a day (QID) | ORAL | Status: DC
  Administered 2016-07-15 – 2016-07-17 (×7): 600 mg via ORAL
  Filled 2016-07-15 (×7): qty 1

## 2016-07-15 MED ORDER — DIPHENHYDRAMINE HCL 25 MG PO CAPS: 25.0000 mg | ORAL_CAPSULE | Freq: Four times a day (QID) | ORAL | Status: DC | PRN

## 2016-07-15 MED ORDER — OXYTOCIN BOLUS FROM INFUSION
500.0000 mL | Freq: Once | INTRAVENOUS | Status: AC
  Administered 2016-07-15: 500 mL via INTRAVENOUS

## 2016-07-15 MED ORDER — PHENYLEPHRINE 40 MCG/ML (10ML) SYRINGE FOR IV PUSH (FOR BLOOD PRESSURE SUPPORT)
80.0000 ug | PREFILLED_SYRINGE | INTRAVENOUS | Status: DC | PRN
  Filled 2016-07-15: qty 5

## 2016-07-15 MED ORDER — DIPHENHYDRAMINE HCL 50 MG/ML IJ SOLN: 12.5000 mg | INTRAMUSCULAR | Status: DC | PRN

## 2016-07-15 MED ORDER — OXYCODONE-ACETAMINOPHEN 5-325 MG PO TABS: 2.0000 | ORAL_TABLET | ORAL | Status: DC | PRN

## 2016-07-15 MED ORDER — DIBUCAINE 1 % RE OINT: 1.0000 "application " | TOPICAL_OINTMENT | RECTAL | Status: DC | PRN

## 2016-07-15 MED ORDER — ONDANSETRON HCL 4 MG PO TABS: 4.0000 mg | ORAL_TABLET | ORAL | Status: DC | PRN

## 2016-07-15 MED ORDER — COCONUT OIL OIL
1.0000 "application " | TOPICAL_OIL | Status: DC | PRN
  Administered 2016-07-17: 1 via TOPICAL
  Filled 2016-07-15: qty 120

## 2016-07-15 MED ORDER — LIDOCAINE HCL (PF) 1 % IJ SOLN
30.0000 mL | INTRAMUSCULAR | Status: DC | PRN
  Filled 2016-07-15: qty 30

## 2016-07-15 MED ORDER — PROMETHAZINE HCL 25 MG/ML IJ SOLN
12.5000 mg | Freq: Four times a day (QID) | INTRAMUSCULAR | Status: DC | PRN
Start: 2016-07-15 — End: 2016-07-15
  Administered 2016-07-15: 12.5 mg via INTRAVENOUS
  Filled 2016-07-15: qty 1

## 2016-07-15 MED ORDER — ONDANSETRON HCL 4 MG/2ML IJ SOLN: 4.0000 mg | Freq: Four times a day (QID) | INTRAMUSCULAR | Status: DC | PRN

## 2016-07-15 MED ORDER — ONDANSETRON HCL 4 MG/2ML IJ SOLN: 4.0000 mg | INTRAMUSCULAR | Status: DC | PRN

## 2016-07-15 MED ORDER — BENZOCAINE-MENTHOL 20-0.5 % EX AERO
1.0000 "application " | INHALATION_SPRAY | CUTANEOUS | Status: DC | PRN
  Administered 2016-07-15: 1 via TOPICAL
  Filled 2016-07-15: qty 56

## 2016-07-15 MED ORDER — ACETAMINOPHEN 325 MG PO TABS
650.0000 mg | ORAL_TABLET | ORAL | Status: DC | PRN
  Administered 2016-07-15 – 2016-07-16 (×2): 650 mg via ORAL
  Filled 2016-07-15 (×2): qty 2

## 2016-07-15 MED ORDER — PRENATAL MULTIVITAMIN CH
1.0000 | ORAL_TABLET | Freq: Every day | ORAL | Status: DC
  Administered 2016-07-16 – 2016-07-17 (×2): 1 via ORAL
  Filled 2016-07-15 (×2): qty 1

## 2016-07-15 MED ORDER — SENNOSIDES-DOCUSATE SODIUM 8.6-50 MG PO TABS
2.0000 | ORAL_TABLET | ORAL | Status: DC
  Administered 2016-07-15 – 2016-07-17 (×2): 2 via ORAL
  Filled 2016-07-15 (×2): qty 2

## 2016-07-15 NOTE — Discharge Instructions (Signed)

## 2016-07-15 NOTE — MAU Note (Signed)
Contractions off and on all night worse now.

## 2016-07-15 NOTE — MAU Note (Signed)
Patient was here earlier this morning contractions have gotten stronger and closer together denies LOF/VB.

## 2016-07-15 NOTE — H&P (Signed)
Faith Woods is a 20 y.o.G2P1001 @ 39.5 wks  female presenting for contractions that are 5-7 min apart. OB History    Gravida Para Term Preterm AB Living   2 1   1   1    SAB TAB Ectopic Multiple Live Births           1     Past Medical History:  Diagnosis Date  . Ischemic bowel syndrome [facility](HCC)    Past Surgical History:  Procedure Laterality Date  . NO PAST SURGERIES     Family History: family history is not on file. Social History:  reports that she has never smoked. She has never used smokeless tobacco. She reports that she does not drink alcohol or use drugs.     Maternal Diabetes: No Genetic Screening: Normal Maternal Ultrasounds/Referrals: Normal Fetal Ultrasounds or other Referrals:  None Maternal Substance Abuse:  No Significant Maternal Medications:  None Significant Maternal Lab Results:  None Other Comments:  None  Review of Systems  Constitutional: Negative.   HENT: Negative.   Eyes: Negative.   Respiratory: Negative.   Cardiovascular: Negative.   Gastrointestinal: Positive for abdominal pain.  Genitourinary: Negative.   Musculoskeletal: Negative.   Skin: Negative.   Neurological: Negative.   Endo/Heme/Allergies: Negative.   Psychiatric/Behavioral: Negative.    Maternal Medical History:  Reason for admission: Contractions.   Contractions: Onset was 13-24 hours ago.   Frequency: regular.   Perceived severity is moderate.    Fetal activity: Perceived fetal activity is normal.   Last perceived fetal movement was within the past hour.    Prenatal complications: no prenatal complications Prenatal Complications - Diabetes: none.    Dilation: 4 Effacement (%): 100 Station: -2 Exam by:: D. Jonelle Bann CNM Blood pressure 117/61, pulse 84, temperature 98.2 F (36.8 C), temperature source Oral, last menstrual period 10/11/2015. Maternal Exam:  Uterine Assessment: Contraction strength is moderate.  Contraction frequency is regular.   Abdomen: Patient  reports no abdominal tenderness. Fetal presentation: vertex  Introitus: Normal vulva. Normal vagina.  Amniotic fluid character: not assessed.  Pelvis: adequate for delivery.   Cervix: Cervix evaluated by digital exam.     Fetal Exam Fetal Monitor Review: Mode: ultrasound.   Variability: moderate (6-25 bpm).   Pattern: accelerations present.    Fetal State Assessment: Category I - tracings are normal.     Physical Exam  Constitutional: She is oriented to person, place, and time. She appears well-developed and well-nourished.  HENT:  Head: Normocephalic.  Eyes: Pupils are equal, round, and reactive to light.  Neck: Normal range of motion.  Cardiovascular: Normal rate, regular rhythm, normal heart sounds and intact distal pulses.   Respiratory: Effort normal and breath sounds normal.  GI: Soft. Bowel sounds are normal.  Genitourinary: Vagina normal and uterus normal.  Musculoskeletal: Normal range of motion.  Neurological: She is alert and oriented to person, place, and time. She has normal reflexes.  Skin: Skin is warm and dry.  Psychiatric: She has a normal mood and affect. Her behavior is normal. Judgment and thought content normal.    Prenatal labs: ABO, Rh: O/Positive/-- (04/20 0000) Antibody:   Rubella: Immune (02/23 0000) RPR: Nonreactive (02/23 0000)  HBsAg: Negative (02/23 0000)  HIV: Non-reactive (02/23 0000)  GBS: Negative (07/05 1533)   Assessment/Plan: SVE 4-5/100/-2, GBS neg, admit   Faith Woods 07/15/2016, 2:26 PM

## 2016-07-15 NOTE — Anesthesia Pain Management Evaluation Note (Signed)
  CRNA Pain Management Visit Note  Patient: Faith Woods, 20 y.o., female  "Hello I am a member of the anesthesia team at [facility]. We have an anesthesia team available at all times to provide care throughout the hospital, including epidural management and anesthesia for C-section. I don't know your plan for the delivery whether it a natural birth, water birth, IV sedation, nitrous supplementation, doula or epidural, but we want to meet your pain goals."   1.Was your pain managed to your expectations on prior hospitalizations?   Unable to assess - patient sleeping  2.What is your expectation for pain management during this hospitalization?     Patient sleeping support person states natural birth with IV pain meds  3.How can we help you reach that goal? Natural IV pain meds  Record the patient's initial score and the patient's pain goal.   Pain: Patient sleeping - unable to assess  Pain Goal: Patient sleeping - unable to assess [facility] wants you to be able to say your pain was always managed very well.  Rica RecordsICKELTON,Keiry Kowal 07/15/2016

## 2016-07-15 NOTE — MAU Note (Signed)
Notified resident patient G2P1  [id number] labor regular contractions fhr reassuring 3/80/-1 intact bloody show, very uncomfortable.

## 2016-07-15 NOTE — Progress Notes (Signed)
Pt does not feel she can sit up for placement of epidural, therefore she no longer wants to proceed with getting an epidural

## 2016-07-16 LAB — RPR: RPR Ser Ql: NONREACTIVE

## 2016-07-16 NOTE — Progress Notes (Signed)
Post Partum Day 1 Subjective: no complaints, up ad lib, voiding and tolerating PO  Objective: Blood pressure (!) 102/58, pulse 77, temperature 98.9 F (37.2 C), temperature source Oral, resp. rate 18, height 5\' 7"  (1.702 m), weight 185 lb (83.9 kg), last menstrual period 10/11/2015, unknown if currently breastfeeding.  Physical Exam:  General: alert, cooperative and no distress Lochia: appropriate Uterine Fundus: firm Incision: n/a DVT Evaluation: No evidence of DVT seen on physical exam. Negative Homan's sign. No cords or calf tenderness.   Recent Labs  07/15/16 1422  HGB 11.7*  HCT 34.6*    Assessment/Plan: Plan for discharge tomorrow   LOS: 1 day   Faith Woods 07/16/2016, 5:45 AM

## 2016-07-16 NOTE — Lactation Note (Signed)
This note was copied from a baby's chart. Lactation Consultation Note  Patient Name: Girl Maryan PulsRhoda Acheampong ZOXWR'UToday's Date: 07/16/2016 Reason for consult: Initial assessment  Baby 14 hours old. Mom nursing the baby when this LC entered the room. Mom reports that baby has been nursing well, but she is not sure that the baby is getting enough. Discussed progression of milk coming to volume. Enc nursing with cues. Mom reports that she is able to hand express drops of colostrum from both breast. [facility], aware of OP/BFSG and LC phone line assistance after D/C.  Maternal Data    Feeding Feeding Type: Breast Fed Length of feed: 15 min  LATCH Score/Interventions Latch: Grasps breast easily, tongue down, lips flanged, rhythmical sucking.  Audible Swallowing: A few with stimulation  Type of Nipple: Everted at rest and after stimulation  Comfort (Breast/Nipple): Soft / non-tender     Hold (Positioning): No assistance needed to correctly position infant at breast.  LATCH Score: 9  Lactation Tools Discussed/Used     Consult Status Consult Status: Follow-up Date: 07/17/16 Follow-up type: In-patient    Geralynn OchsWILLIARD, Sirus Labrie 07/16/2016, 12:03 PM

## 2016-07-17 MED ORDER — OXYCODONE-ACETAMINOPHEN 5-325 MG PO TABS: 1.0000 | ORAL_TABLET | ORAL | 0 refills | Status: DC | PRN

## 2016-07-17 MED ORDER — IBUPROFEN 600 MG PO TABS: 600.0000 mg | ORAL_TABLET | Freq: Four times a day (QID) | ORAL | 2 refills | Status: DC

## 2016-07-17 MED ORDER — SENNOSIDES-DOCUSATE SODIUM 8.6-50 MG PO TABS: 2.0000 | ORAL_TABLET | Freq: Two times a day (BID) | ORAL | 1 refills | Status: DC

## 2016-07-17 NOTE — Progress Notes (Signed)
Post Partum Day #2 Subjective: no complaints, up ad lib, voiding, tolerating PO and + flatus  Objective: Blood pressure 104/66, pulse 85, temperature 98.7 F (37.1 C), temperature source Oral, resp. rate 16, height 5\' 7"  (1.702 m), weight 185 lb (83.9 kg), last menstrual period 10/11/2015, SpO2 99 %, unknown if currently breastfeeding.  Physical Exam:  General: alert, cooperative and no distress Lochia: appropriate Uterine Fundus: firm Incision: none DVT Evaluation: No evidence of DVT seen on physical exam. No cords or calf tenderness. No significant calf/ankle edema.   Recent Labs  07/15/16 1422  HGB 11.7*  HCT 34.6*    Assessment/Plan: Discharge home and Breastfeeding   LOS: 2 days   Roe CoombsRachelle A Manan Olmo, CNM 07/17/2016, 8:27 AM

## 2016-07-17 NOTE — Discharge Summary (Signed)
Obstetric Discharge Summary Reason for Admission: onset of labor Prenatal Procedures: NST and ultrasound Intrapartum Procedures: spontaneous vaginal delivery Postpartum Procedures: none Complications-Operative and Postpartum: none Hemoglobin  Date Value Ref Range Status  07/15/2016 11.7 (L) 12.0 - 15.0 g/dL Final  19/14/782904/17/2017 56.210.5 g/dL Final   HCT  Date Value Ref Range Status  07/15/2016 34.6 (L) 36.0 - 46.0 % Final  03/26/2016 31 % Final    Physical Exam:  General: alert, cooperative and no distress Lochia: appropriate Uterine Fundus: firm Incision: none DVT Evaluation: No evidence of DVT seen on physical exam. No cords or calf tenderness. No significant calf/ankle edema.  Discharge Diagnoses: Term Pregnancy-delivered  Discharge Information: Date: 07/17/2016 Activity: pelvic rest Diet: routine Medications: PNV, Ibuprofen, Colace and Percocet Condition: stable Instructions: refer to practice specific booklet Discharge to: home Follow-up Information    Ashok Sawaya A Claudie Rathbone, CNM Follow up in 2 week(s).   Specialty:  Certified Nurse Midwife Contact information: [address] [place] [id number] [phone]           Newborn Data: Live born female  Birth Weight: 7 lb 9.3 oz (3440 g) APGAR: 8, 9  Home with mother.  Roe Coombsachelle A Aletta Edmunds, CNM 07/17/2016, 8:42 AM

## 2016-07-17 NOTE — Lactation Note (Signed)
This note was copied from a baby's chart. Lactation Consultation Note: Mother states that she just fed infant for 45 mins. She is unsure is her milk is in yet. Mother states that she breastfed and bottle fed her first for 3 months, but she wants to exclusively breastfeed this chid. Advised mother to feed infant 8-12 in 24 hours as well as with all feeding cues. Advised mother to do good breast massage and ice. Rouse infant well with skin to skin for better feedings. Mother receptive to all teaching. She denies having any nipple pain or tenderness. Mother informed of available LC services and community support.  Patient Name: Girl Maryan PulsRhoda Acheampong UUVOZ'DToday's Date: 07/17/2016 Reason for consult: Follow-up assessment   Maternal Data    Feeding Feeding Type: Breast Fed Length of feed: 45 min (per mother)  [facility]                      Lactation Tools Discussed/Used     Consult Status Consult Status: Complete    Michel BickersKendrick, Carey Johndrow McCoy 07/17/2016, 9:31 AM

## 2016-07-17 NOTE — Progress Notes (Signed)
Patient prescribed Percocet 1-2 tablets q4 PRN for home. RN called CNM to question the order; informing her the patient didn't require any narcotic pain meds during stay nor were any ordered. Patient's pain was well controlled with Motrin. CNM stated that she wanted script given to patient anyway for just in case. RN educated patient about narcotic pain use, the chance for addiction and constipation.

## 2016-07-18 ENCOUNTER — Encounter: Payer: Medicaid Other | Admitting: Certified Nurse Midwife

## 2016-07-18 NOTE — Telephone Encounter (Signed)
Message left to not take Tamiflu [facility].

## 2016-08-09 ENCOUNTER — Ambulatory Visit: Payer: Medicaid Other | Admitting: Certified Nurse Midwife

## 2016-08-10 ENCOUNTER — Ambulatory Visit (INDEPENDENT_AMBULATORY_CARE_PROVIDER_SITE_OTHER): Payer: Medicaid Other | Admitting: Obstetrics

## 2016-08-10 DIAGNOSIS — Z30011 Encounter for initial prescription of contraceptive pills: Secondary | ICD-10-CM

## 2016-08-10 LAB — POCT URINALYSIS DIPSTICK
BILIRUBIN UA: NEGATIVE
GLUCOSE UA: NEGATIVE
Ketones, UA: NEGATIVE
NITRITE UA: NEGATIVE
Protein, UA: NEGATIVE
RBC UA: NEGATIVE
SPEC GRAV UA: 1.02
Urobilinogen, UA: 0.2
pH, UA: 5

## 2016-08-10 MED ORDER — NORETHINDRONE 0.35 MG PO TABS: 1.0000 | ORAL_TABLET | Freq: Every day | ORAL | 11 refills | Status: DC

## 2016-08-15 ENCOUNTER — Encounter: Payer: Self-pay | Admitting: Obstetrics

## 2016-08-15 NOTE — Progress Notes (Signed)
Subjective:     Faith Woods is a 20 y.o. female who presents for a postpartum visit. She is 2 weeks postpartum following a spontaneous vaginal delivery. I have fully reviewed the prenatal and intrapartum course. The delivery was at 39 gestational weeks. Outcome: spontaneous vaginal delivery. Anesthesia: none. Postpartum course has been normal. Baby's course has been normal. Baby is feeding by breast. Bleeding thin lochia. Bowel function is normal. Bladder function is normal. Patient is not sexually active. Contraception method is abstinence. Postpartum depression screening: negative.  Tobacco, alcohol and substance abuse history reviewed.  Adult immunizations reviewed including TDAP, rubella and varicella.  The following portions of the patient's history were reviewed and updated as appropriate: allergies, current medications, past family history, past medical history, past social history, past surgical history and problem list.  Review of Systems A comprehensive review of systems was negative.   Objective:    BP 105/68   Pulse 89   Temp 98.6 F (37 C) (Oral)   Wt 168 lb 14.4 oz (76.6 kg)   Breastfeeding? Yes   BMI 26.45 kg/m    PE:  Deferred   >50% of 10 min visit spent on counseling and coordination of care.   Assessment:    2 weeks postpartum.  Doing well.  Contraceptive counseling and advice.  Considering contraceptive options.  Plan:    1. Contraception: Considering options 2. Continue PNV's 3. Follow up in: 4 weeks or as needed.   Healthy lifestyle practices reviewed

## 2016-09-07 ENCOUNTER — Encounter: Payer: Self-pay | Admitting: Obstetrics

## 2016-09-07 ENCOUNTER — Ambulatory Visit (INDEPENDENT_AMBULATORY_CARE_PROVIDER_SITE_OTHER): Payer: Medicaid Other | Admitting: Obstetrics

## 2016-09-07 DIAGNOSIS — N898 Other specified noninflammatory disorders of vagina: Secondary | ICD-10-CM | POA: Diagnosis not present

## 2016-09-07 DIAGNOSIS — Z3041 Encounter for surveillance of contraceptive pills: Secondary | ICD-10-CM

## 2016-09-07 MED ORDER — CLINDAMYCIN HCL 300 MG PO CAPS: 300.0000 mg | ORAL_CAPSULE | Freq: Three times a day (TID) | ORAL | 0 refills | Status: DC

## 2016-09-10 ENCOUNTER — Encounter: Payer: Self-pay | Admitting: Obstetrics

## 2016-09-11 LAB — NUSWAB VG, CANDIDA 6SP
Atopobium vaginae: HIGH Score — AB
BVAB 2: HIGH Score — AB
CANDIDA GLABRATA, NAA: NEGATIVE
CANDIDA KRUSEI, NAA: NEGATIVE
CANDIDA LUSITANIAE, NAA: NEGATIVE
Candida albicans, NAA: NEGATIVE
Candida parapsilosis, NAA: NEGATIVE
Candida tropicalis, NAA: NEGATIVE
Megasphaera 1: HIGH Score — AB
TRICH VAG BY NAA: NEGATIVE

## 2016-09-12 ENCOUNTER — Encounter: Payer: Self-pay | Admitting: Obstetrics

## 2016-09-12 NOTE — Progress Notes (Signed)
Subjective:     Faith Woods is a 20 y.o. female who presents for a postpartum visit. She is 6 weeks postpartum following a spontaneous vaginal delivery. I have fully reviewed the prenatal and intrapartum course. The delivery was at 39 gestational weeks. Outcome: spontaneous vaginal delivery. Anesthesia: none. Postpartum course has been normal. Baby's course has been normal. Baby is feeding by breast. Bleeding no bleeding. Bowel function is normal. Bladder function is normal. Patient is not sexually active. Contraception method is oral progesterone-only contraceptive. Postpartum depression screening: negative.  Tobacco, alcohol and substance abuse history reviewed.  Adult immunizations reviewed including TDAP, rubella and varicella.  The following portions of the patient's history were reviewed and updated as appropriate: allergies, current medications, past family history, past medical history, past social history, past surgical history and problem list.  Review of Systems A comprehensive review of systems was negative.   Objective:    BP 103/71   Pulse 71   General:  alert and no distress   Breasts:  inspection negative, no nipple discharge or bleeding, no masses or nodularity palpable  Lungs: clear to auscultation bilaterally  Heart:  regular rate and rhythm, S1, S2 normal, no murmur, click, rub or gallop  Abdomen: soft, non-tender; bowel sounds normal; no masses,  no organomegaly   Vulva:  normal  Vagina: normal vagina  Cervix:  no cervical motion tenderness  Corpus: normal size, contour, position, consistency, mobility, non-tender  Adnexa:  no mass, fullness, tenderness  Rectal Exam: Not performed.          50% of 15 min visit spent on counseling and coordination of care.   Assessment:     Normal postpartum exam. Pap smear not done at today's visit.    Contraceptive counseling and advice.    Plan:    1. Contraception: oral progesterone-only contraceptive 2. Micronor  Rx 3. Follow up in: 6 weeks or as needed.   Healthy lifestyle practices reviewed

## 2016-09-14 ENCOUNTER — Other Ambulatory Visit: Payer: Self-pay | Admitting: Obstetrics

## 2016-10-18 ENCOUNTER — Encounter: Payer: Self-pay | Admitting: Obstetrics

## 2016-10-18 ENCOUNTER — Ambulatory Visit (INDEPENDENT_AMBULATORY_CARE_PROVIDER_SITE_OTHER): Payer: Self-pay | Admitting: Obstetrics

## 2016-10-18 DIAGNOSIS — K5901 Slow transit constipation: Secondary | ICD-10-CM

## 2016-10-18 DIAGNOSIS — Z3041 Encounter for surveillance of contraceptive pills: Secondary | ICD-10-CM

## 2016-10-18 NOTE — Progress Notes (Signed)
Subjective:     Faith PulsRhoda Acheampong is a 20 y.o. female who presents for a postpartum visit. She is 3 months postpartum following a spontaneous vaginal delivery. I have fully reviewed the prenatal and intrapartum course. The delivery was at 39 gestational weeks.  Anesthesia: none. Postpartum course has been normal. Baby's course has been normal. Baby is feeding by breast. Bleeding no bleeding. Bowel function is complicated by constipation.  Bladder function is normal. Patient is sexually active. Contraception method is oral progesterone-only contraceptive. Postpartum depression screening: negative.  Tobacco, alcohol and substance abuse history reviewed.  Adult immunizations reviewed including TDAP, rubella and varicella.  The following portions of the patient's history were reviewed and updated as appropriate: allergies, current medications, past family history, past medical history, past social history, past surgical history and problem list.  Review of Systems A comprehensive review of systems was negative. , except for constipation.  Objective:    BP 104/64   Pulse 87   Temp 97.8 F (36.6 C) (Oral)   Ht 5' (1.524 m)   Wt 184 lb 4.8 oz (83.6 kg)   Breastfeeding? Yes   BMI 35.99 kg/m   General:  alert and no distress   Breasts:  inspection negative, no nipple discharge or bleeding, no masses or nodularity palpable  Lungs: clear to auscultation bilaterally  Heart:  regular rate and rhythm, S1, S2 normal, no murmur, click, rub or gallop  Abdomen: soft, non-tender; bowel sounds normal; no masses,  no organomegaly   Vulva:  normal  Vagina: normal vagina  Cervix:  no cervical motion tenderness  Corpus: normal size, contour, position, consistency, mobility, non-tender  Adnexa:  no mass, fullness, tenderness  Rectal Exam: Not performed.          50% of 15 min visit spent on counseling and coordination of care.    Assessment:     Normal postpartum exam. Pap smear not done at today's visit.      Constipation  Plan:    1. Contraception: oral progesterone-only contraceptive 2. Colace and MOM recommended for constipation, along with dietary changes 3. Follow up in: 1 year or as needed.   Healthy lifestyle practices reviewed

## 2017-04-29 IMAGING — US US MFM OB COMP + 14 WK
1 series · 13 of 28 positions shown · non-contrast
Comparison: none

[Series 1: us mfm ob comp + 14 wk · 13 of 126 slices shown]
[im 5/126]
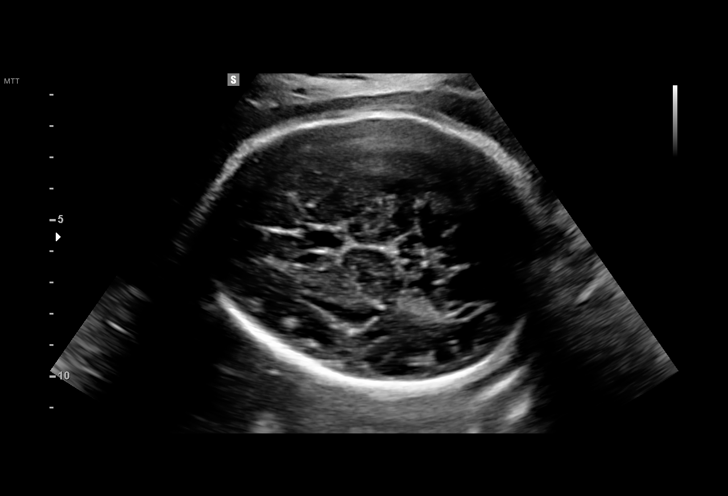
[im 14/126]
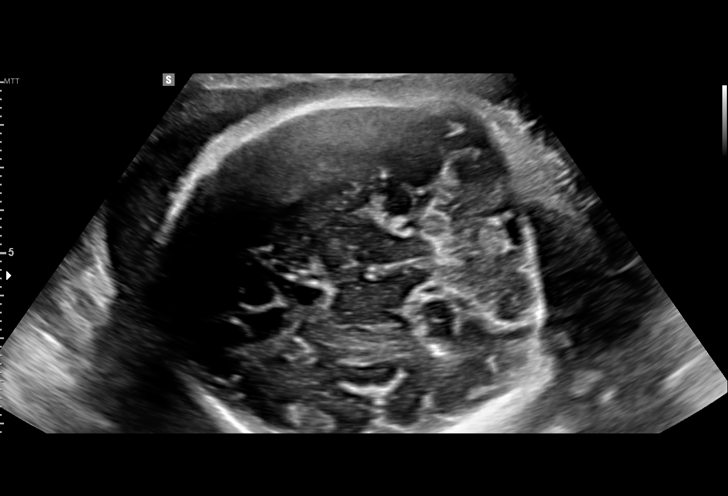
[im 24/126]
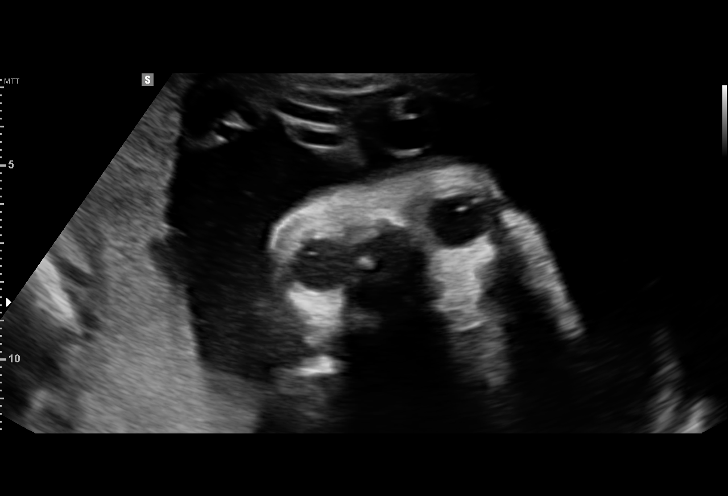
[im 33/126]
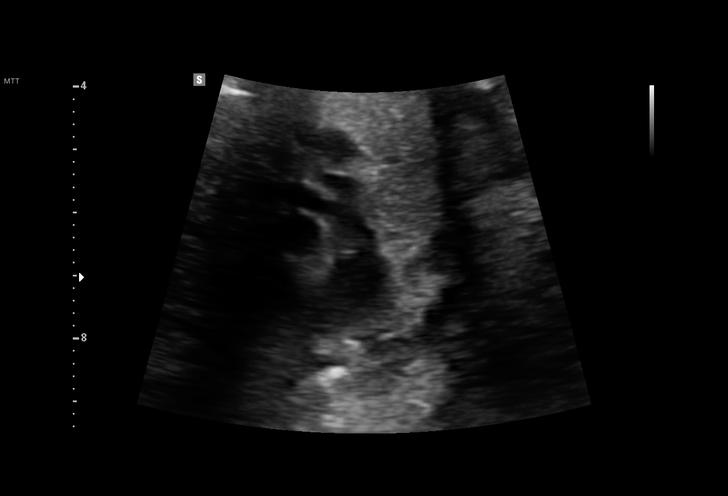
[im 42/126]
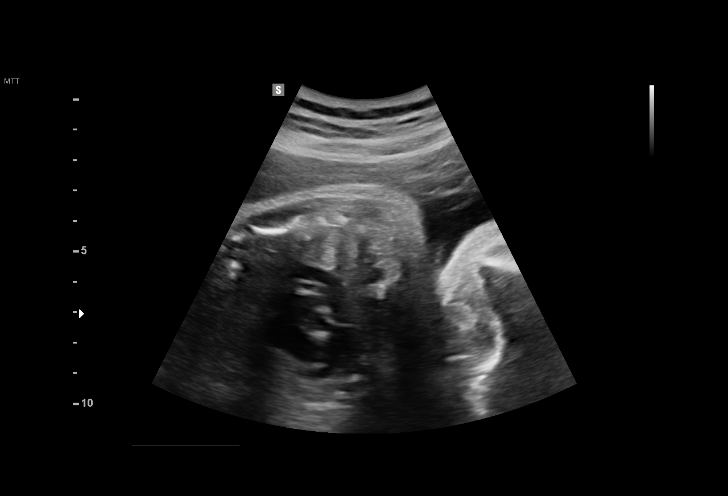
[im 51/126]
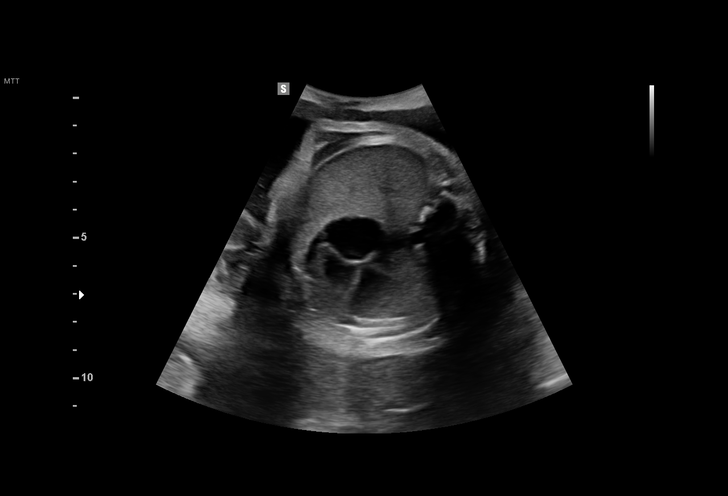
[im 65/126]
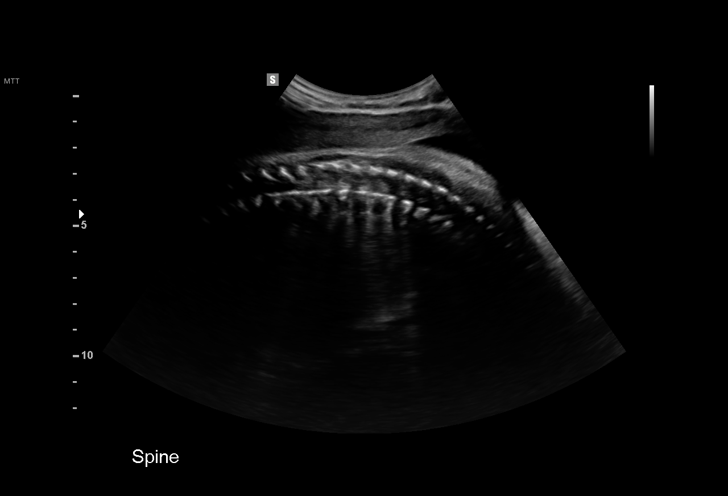
[im 75/126]
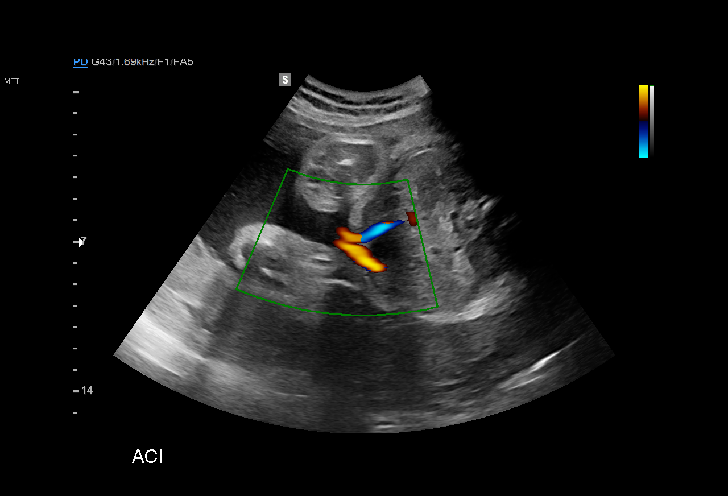
[im 84/126]
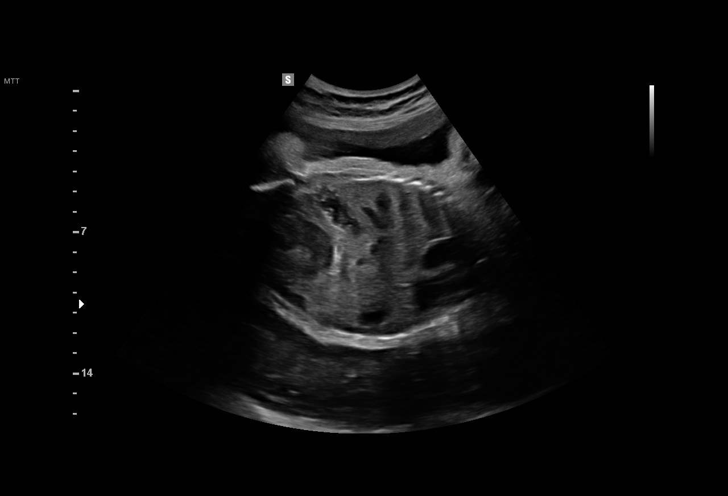
[im 93/126]
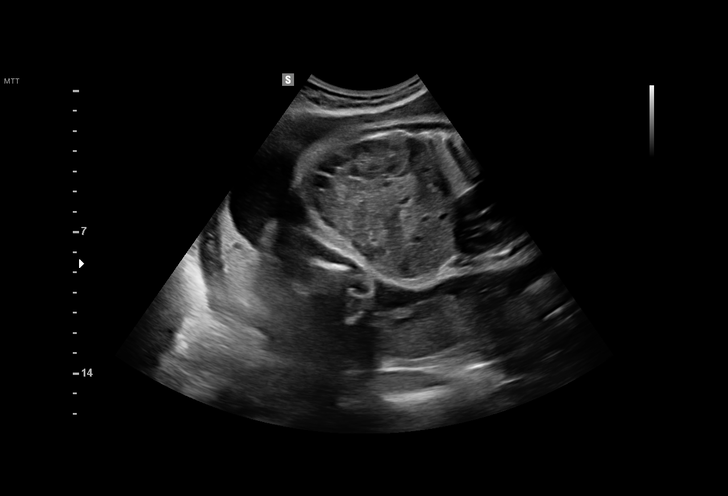
[im 102/126]
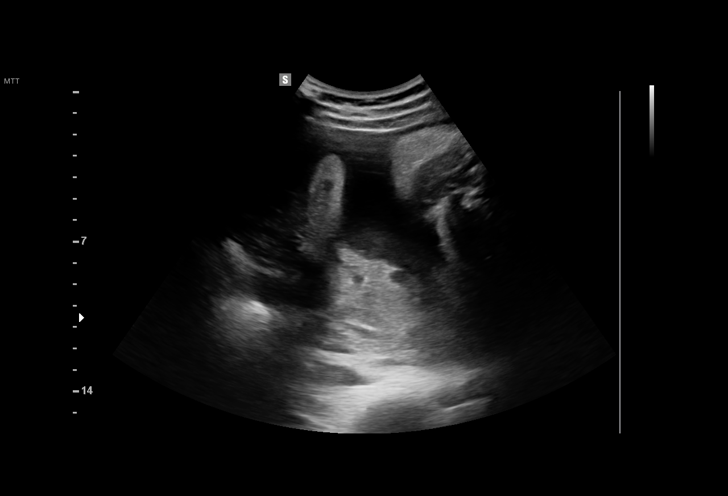
[im 112/126]
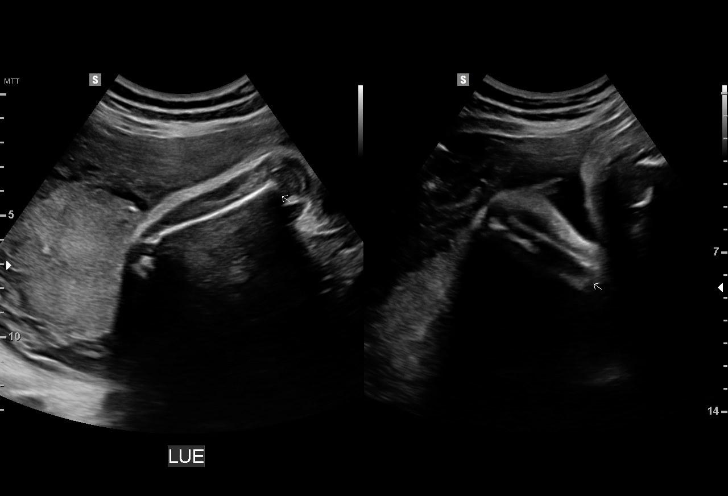
[im 121/126]
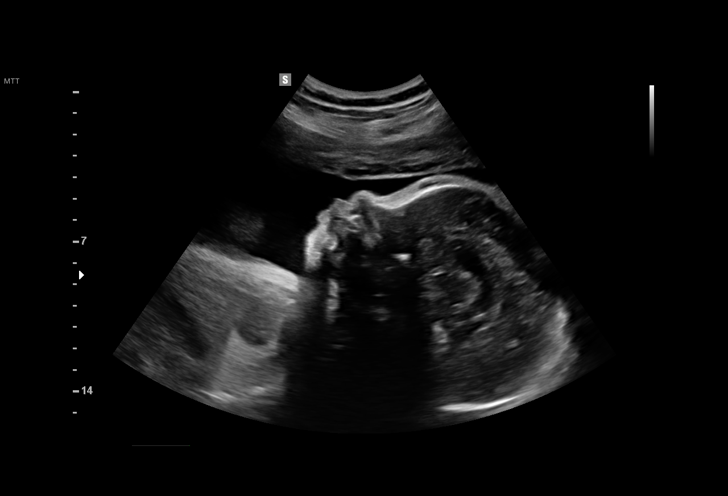

[13 of 28 positions shown; findings below may reference images not displayed]

1  CALEB AUJLA           [id number]      [id number]     [id number]
Indications

Basic anatomic survey                          Z36
31 weeks gestation of pregnancy
Poor obstetric history: Previous preterm
delivery, antepartum (28 weeks)
OB History

Gravidity:    2         Term:   0        Prem:   1        SAB:   0
TOP:          0       Ectopic:  0        Living: 1
Fetal Evaluation

Num Of Fetuses:     1
Fetal Heart         149
Rate(bpm):
Cardiac Activity:   Observed
Presentation:       Cephalic
Placenta:           Posterior, above cervical os
P. Cord Insertion:  Not well visualized

Amniotic Fluid
AFI FV:      Subjectively within normal limits

AFI Sum(cm)     %Tile       Largest Pocket(cm)
20.0            76

RUQ(cm)       RLQ(cm)       LUQ(cm)        LLQ(cm)
4.94
Biometry
BPD:      83.5  mm     G. Age:  33w 4d         87  %    CI:        74.19   %   70 - 86
FL/HC:      19.1   %   19.1 -
HC:      307.8  mm     G. Age:  34w 3d         78  %    HC/AC:      1.08       0.96 -
AC:       284   mm     G. Age:  32w 3d         65  %    FL/BPD:     70.5   %   71 - 87
FL:       58.9  mm     G. Age:  30w 5d         13  %    FL/AC:      20.7   %   20 - 24
HUM:      52.4  mm     G. Age:  30w 4d         24  %
CER:      43.6  mm     G. Age:  37w 4d       > 95  %

CM:        6.1  mm
Est. FW:    5856  gm      4 lb 4 oz     63  %
Gestational Age

LMP:           31w 6d       Date:   10/11/15                 EDD:   07/17/16
U/S Today:     32w 6d                                        EDD:   07/10/16
Best:          31w 6d    Det. By:   LMP  (10/11/15)          EDD:   07/17/16
Anatomy

Cranium:               Appears normal         Aortic Arch:            Appears normal
Cavum:                 Appears normal         Ductal Arch:            Appears normal
Ventricles:            Appears normal         Diaphragm:              Appears normal
Choroid Plexus:        Appears normal         Stomach:                Appears normal, left
sided
Cerebellum:            Appears normal         Abdomen:                Appears normal
Posterior Fossa:       Appears normal         Abdominal Wall:         Appears nml (cord
insert, abd wall)
Nuchal Fold:           Not applicable (>20    Cord Vessels:           Appears normal (3
wks GA)                                        vessel cord)
Face:                  Appears normal         Kidneys:                Appear normal
(orbits and profile)
Lips:                  Appears normal         Bladder:                Appears normal
Thoracic:              Appears normal         Spine:                  Appears normal
Heart:                 Appears normal         Upper Extremities:      Visualized
(4CH, axis, and
situs)
RVOT:                  Appears normal         Lower Extremities:      Visualized
LVOT:                  Appears normal

Other:  Fetus appears to be a female. Nasal bone visualized. Technically
difficult due to advanced GA and fetal position.
Cervix Uterus Adnexa

Cervix
Length:            3.2  cm.
Normal appearance by transabdominal scan.

Uterus
No abnormality visualized.

Left Ovary
Size(cm)     3.25  x    1.27   x  1.25      Vol(ml):
Within normal limits. No adnexal mass visualized.

Right Ovary
Size(cm)     2.99  x    1.6    x  1.85      Vol(ml):
Within normal limits. No adnexal mass visualized.
Cul De Sac:   No free fluid seen.
Adnexa:       No abnormality visualized.
Impression

SIUP at 31+6 weeks
Normal detailed fetal anatomy
Normal amniotic fluid volume
Measurements consistent with LMP dating; EFW at the 63rd
%tile
Recommendations

Follow-up ultrasounds as clinically indicated.

## 2018-01-19 ENCOUNTER — Emergency Department (HOSPITAL_COMMUNITY)
Admission: EM | Admit: 2018-01-19 | Discharge: 2018-01-19 | Disposition: A | Discharge status: Home / Self Care | Payer: Self-pay | Attending: Physician Assistant | Admitting: Physician Assistant

## 2018-01-19 ENCOUNTER — Encounter (HOSPITAL_COMMUNITY): Payer: Self-pay

## 2018-01-19 ENCOUNTER — Other Ambulatory Visit: Payer: Self-pay

## 2018-01-19 ENCOUNTER — Emergency Department (HOSPITAL_COMMUNITY): Payer: Self-pay

## 2018-01-19 DIAGNOSIS — B9689 Other specified bacterial agents as the cause of diseases classified elsewhere: Secondary | ICD-10-CM

## 2018-01-19 DIAGNOSIS — R1030 Lower abdominal pain, unspecified: Secondary | ICD-10-CM | POA: Insufficient documentation

## 2018-01-19 DIAGNOSIS — N760 Acute vaginitis: Secondary | ICD-10-CM

## 2018-01-19 LAB — URINALYSIS, ROUTINE W REFLEX MICROSCOPIC
BILIRUBIN URINE: NEGATIVE
GLUCOSE, UA: NEGATIVE mg/dL
HGB URINE DIPSTICK: NEGATIVE
Ketones, ur: NEGATIVE mg/dL
Leukocytes, UA: NEGATIVE
Nitrite: NEGATIVE
Protein, ur: NEGATIVE mg/dL
Specific Gravity, Urine: 1.004 — ABNORMAL LOW (ref 1.005–1.030)
pH: 6 (ref 5.0–8.0)

## 2018-01-19 LAB — CBC
HCT: 40.4 % (ref 36.0–46.0)
Hemoglobin: 13.1 g/dL (ref 12.0–15.0)
MCH: 28.7 pg (ref 26.0–34.0)
MCHC: 32.4 g/dL (ref 30.0–36.0)
MCV: 88.4 fL (ref 78.0–100.0)
PLATELETS: 249 10*3/uL (ref 150–400)
RBC: 4.57 MIL/uL (ref 3.87–5.11)
RDW: 12.4 % (ref 11.5–15.5)
WBC: 7.2 10*3/uL (ref 4.0–10.5)

## 2018-01-19 LAB — COMPREHENSIVE METABOLIC PANEL
ALBUMIN: 4.5 g/dL (ref 3.5–5.0)
ALK PHOS: 42 U/L (ref 38–126)
ALT: 15 U/L (ref 14–54)
AST: 21 U/L (ref 15–41)
Anion gap: 10 (ref 5–15)
BUN: 7 mg/dL (ref 6–20)
CALCIUM: 9.5 mg/dL (ref 8.9–10.3)
CO2: 23 mmol/L (ref 22–32)
Chloride: 106 mmol/L (ref 101–111)
Creatinine, Ser: 0.65 mg/dL (ref 0.44–1.00)
GFR calc non Af Amer: >60 mL/min (ref >60)
GLUCOSE: 99 mg/dL (ref 65–99)
Potassium: 3.6 mmol/L (ref 3.5–5.1)
Sodium: 139 mmol/L (ref 135–145)
Total Bilirubin: 0.6 mg/dL (ref 0.3–1.2)
Total Protein: 7.9 g/dL (ref 6.5–8.1)

## 2018-01-19 LAB — LIPASE, BLOOD: LIPASE: 39 U/L (ref 11–51)

## 2018-01-19 LAB — WET PREP, GENITAL
SPERM: NONE SEEN
TRICH WET PREP: NONE SEEN
YEAST WET PREP: NONE SEEN

## 2018-01-19 LAB — POC URINE PREG, ED: Preg Test, Ur: NEGATIVE

## 2018-01-19 LAB — I-STAT BETA HCG BLOOD, ED (MC, WL, AP ONLY): I-stat hCG, quantitative: <5 m[IU]/mL (ref <5)

## 2018-01-19 MED ORDER — METRONIDAZOLE 500 MG PO TABS: 500.0000 mg | ORAL_TABLET | Freq: Once | ORAL | Status: DC

## 2018-01-19 MED ORDER — METRONIDAZOLE 500 MG PO TABS: 500.0000 mg | ORAL_TABLET | Freq: Two times a day (BID) | ORAL | 0 refills | Status: DC

## 2018-01-19 NOTE — ED Notes (Signed)
Pelvic cart set up at bed.

## 2018-01-19 NOTE — ED Provider Notes (Signed)
[facility] EMERGENCY DEPARTMENT Provider Note   CSN: [id number] Arrival date & time: 01/19/18  1543     History   Chief Complaint Chief Complaint  Patient presents with  . Abdominal Pain    HPI Faith Woods is a 22 y.o. female.  HPI   Patient is a 22 year old female presenting with occasional adnexal pain for the last 4 weeks.  Patient took a Depo shot in November.  She reports then in February she started taking oral contraceptives.  She reports that she had some occasional pains to her adnexa.  She vomited one time.  No diarrhea.  No fevers.  Patient reports examining herself several times and has no real tenderness of touch.  She reports a white discharge.  She reports dyspareunia.  She reports only having sex with her husband.  She reports that one time several years ago she had blood in her urine in [place] and she was presumably diagnosed with a kidney stone.  Patient reports that she is googled her symptoms symptoms and is worried that it could be her liver today.    Past Medical History:  Diagnosis Date  . Ischemic bowel syndrome [facility])     Patient Active Problem List   Diagnosis Date Noted  . Active labor at term 07/15/2016  . Encounter for supervision of other normal pregnancy in third trimester 06/20/2016    Past Surgical History:  Procedure Laterality Date  . NO PAST SURGERIES      OB History    Gravida Para Term Preterm AB Living   2 2 1 1   2    SAB TAB Ectopic Multiple Live Births         0 2       Home Medications    Prior to Admission medications   Medication Sig Start Date End Date Taking? Authorizing Provider  clindamycin (CLEOCIN) 300 MG capsule Take 1 capsule (300 mg total) by mouth 3 (three) times daily. Patient not taking: Reported on 10/18/2016 09/07/16   Brock Bad, MD  ibuprofen (ADVIL,MOTRIN) 600 MG tablet Take 1 tablet (600 mg total) by mouth 4 (four) times daily. 07/17/16   Orvilla Cornwall A, CNM    norethindrone (MICRONOR,CAMILA,ERRIN) 0.35 MG tablet Take 1 tablet (0.35 mg total) by mouth daily. 08/10/16   Brock Bad, MD  oxyCODONE-acetaminophen (ROXICET) 5-325 MG tablet Take 1-2 tablets by mouth every 4 (four) hours as needed for severe pain. Patient not taking: Reported on 10/18/2016 07/17/16   Orvilla Cornwall A, CNM  Prenat-FePoly-Metf-FA-DHA-DSS (VITAFOL FE+) 90-1-200 & 50 MG CPPK Take 1 tablet by mouth daily before breakfast. 05/16/16   Brock Bad, MD    Family History No family history on file.  Social History Social History   Tobacco Use  . Smoking status: Never Smoker  . Smokeless tobacco: Never Used  Substance Use Topics  . Alcohol use: No  . Drug use: No     Allergies   Patient has no known allergies.   Review of Systems Review of Systems  Constitutional: Negative for appetite change, fatigue and fever.  HENT: Negative for congestion.   Gastrointestinal: Positive for abdominal pain, nausea and vomiting.  Genitourinary: Positive for difficulty urinating, dyspareunia, dysuria, menstrual problem, pelvic pain, urgency, vaginal discharge and vaginal pain.  All other systems reviewed and are negative.    Physical Exam Updated Vital Signs BP 127/77 (BP Location: Right Arm)   Pulse 95   Temp 99.1 F (37.3 C) (Oral)  Resp 16   LMP 08/19/2017 (Within Weeks)   SpO2 100%   Physical Exam  Constitutional: She is oriented to person, place, and time. She appears well-developed and well-nourished.  HENT:  Head: Normocephalic and atraumatic.  Eyes: Right eye exhibits no discharge.  Cardiovascular: Normal rate, regular rhythm and normal heart sounds.  No murmur heard. Pulmonary/Chest: Effort normal and breath sounds normal. She has no wheezes. She has no rales.  Abdominal: Soft. She exhibits no distension. There is tenderness in the suprapubic area.  Right adnexal pain.  Very mild only to deep palpation.  Neurological: She is oriented to person, place,  and time.  Skin: Skin is warm and dry. She is not diaphoretic.  Psychiatric: She has a normal mood and affect.  Nursing note and vitals reviewed.    ED Treatments / Results  Labs (all labs ordered are listed, but only abnormal results are displayed) Labs Reviewed  WET PREP, GENITAL  LIPASE, BLOOD  COMPREHENSIVE METABOLIC PANEL  CBC  URINALYSIS, ROUTINE W REFLEX MICROSCOPIC  RPR  HIV ANTIBODY (ROUTINE TESTING)  I-STAT BETA HCG BLOOD, ED ([facility], [facility], [facility] ONLY)  POC URINE PREG, ED  GC/CHLAMYDIA PROBE AMP ([place]) NOT AT [facility]    EKG  EKG Interpretation None       Radiology No results found.  Procedures Procedures (including critical care time)  Medications Ordered in ED Medications - No data to display   Initial Impression / Assessment and Plan / ED Course  I have reviewed the triage vital signs and the nursing notes.  Pertinent labs & imaging results that were available during my care of the patient were reviewed by me and considered in my medical decision making (see chart for details).      Patient is a 22 year old female presenting with occasional adnexal pain for the last 4 weeks.  Patient took a Depo shot in November.  She reports then in February she started taking oral contraceptives.  She reports that she had some occasional pains to her adnexa.  She vomited one time.  No diarrhea.  No fevers.  Patient reports examining herself several times and has no real tenderness of touch.  She reports a white discharge.  She reports dyspareunia.  She reports only having sex with her husband.  She reports that one time several years ago she had blood in her urine in [place] and she was presumably diagnosed with a kidney stone.  Patient reports that she is googled her symptoms symptoms and is worried that it could be her liver today.    4:36 PM Patient's symptoms sound very episodic and chronic in nature.  Will do a vaginal exam, transvaginal ultrasound.  And do labs  to rule out any intra-abdominal pathology.  Patient has normal vital signs and appears very well on exam.  BV positive, would likely explain patient's symptoms.  Will discharge with antibiotics, follow-up with primary care.  Final Clinical Impressions(s) / ED Diagnoses   Final diagnoses:  None    ED Discharge Orders    None       Abelino DerrickMackuen, Courteney Lyn, MD 01/23/18 [phone]

## 2018-01-19 NOTE — ED Triage Notes (Signed)
Per Pt, Pt is coming from home with complaints of lower right abdominal pani, flank pain, vomiting, and nausea for about two weeks. Reports some burning with urination.

## 2018-01-19 NOTE — ED Notes (Signed)
Pt discharged from ED; instructions provided and scripts given; Pt encouraged to return to ED if symptoms worsen and to f/u with PCP; Pt verbalized understanding of all instructions 

## 2018-01-19 NOTE — Discharge Instructions (Signed)
You have bacterial vaginosis.  Please make sure that you do not use any harsh chemicals in your vagina, urinate after sex, and use antibiotics to help.  Please return to follow-up with your primary care.

## 2018-01-20 LAB — GC/CHLAMYDIA PROBE AMP (~~LOC~~) NOT AT ARMC
Chlamydia: NEGATIVE
Neisseria Gonorrhea: NEGATIVE

## 2018-09-01 ENCOUNTER — Ambulatory Visit (INDEPENDENT_AMBULATORY_CARE_PROVIDER_SITE_OTHER): Payer: Medicaid Other | Admitting: *Deleted

## 2018-09-01 ENCOUNTER — Encounter: Payer: Self-pay | Admitting: Obstetrics

## 2018-09-01 DIAGNOSIS — Z3201 Encounter for pregnancy test, result positive: Secondary | ICD-10-CM

## 2018-09-01 DIAGNOSIS — N912 Amenorrhea, unspecified: Secondary | ICD-10-CM

## 2018-09-01 NOTE — Progress Notes (Signed)
Ms. Adu-Acheampong presents today for UPT. She has no unusual complaints and complains of nausea with vomiting occasional vomitting and fatigue.  LMP:07/12/18    OBJECTIVE: Appears well, in no apparent distress.  OB History    Gravida  2   Para  2   Term  1   Preterm  1   AB      Living  2     SAB      TAB      Ectopic      Multiple  0   Live Births  2         Home UPT Result:Positive In-Office UPT result:Positive   ASSESSMENT: Positive pregnancy test  PLAN Prenatal care to be completed at: [facility]  PNV samples given today.  Pt made aware she may take Unisom and Vit B6 for nausea until she gets active insurance, then she may call for Rx.

## 2018-09-08 ENCOUNTER — Telehealth: Payer: Self-pay

## 2018-09-08 ENCOUNTER — Inpatient Hospital Stay (HOSPITAL_COMMUNITY)
Admission: AD | Admit: 2018-09-08 | Discharge: 2018-09-09 | Disposition: A | Discharge status: Home / Self Care | Payer: Medicaid Other | Source: Ambulatory Visit | Attending: Obstetrics and Gynecology | Admitting: Obstetrics and Gynecology

## 2018-09-08 ENCOUNTER — Encounter (HOSPITAL_COMMUNITY): Payer: Self-pay

## 2018-09-08 ENCOUNTER — Other Ambulatory Visit: Payer: Self-pay

## 2018-09-08 DIAGNOSIS — R109 Unspecified abdominal pain: Secondary | ICD-10-CM | POA: Insufficient documentation

## 2018-09-08 DIAGNOSIS — O210 Mild hyperemesis gravidarum: Secondary | ICD-10-CM | POA: Insufficient documentation

## 2018-09-08 DIAGNOSIS — O219 Vomiting of pregnancy, unspecified: Secondary | ICD-10-CM

## 2018-09-08 DIAGNOSIS — Z3A08 8 weeks gestation of pregnancy: Secondary | ICD-10-CM | POA: Diagnosis not present

## 2018-09-08 LAB — URINALYSIS, ROUTINE W REFLEX MICROSCOPIC
BILIRUBIN URINE: NEGATIVE
GLUCOSE, UA: NEGATIVE mg/dL
HGB URINE DIPSTICK: NEGATIVE
Ketones, ur: NEGATIVE mg/dL
LEUKOCYTES UA: NEGATIVE
Nitrite: NEGATIVE
PH: 5 (ref 5.0–8.0)
Protein, ur: NEGATIVE mg/dL
SPECIFIC GRAVITY, URINE: 1.025 (ref 1.005–1.030)

## 2018-09-08 LAB — POCT PREGNANCY, URINE: PREG TEST UR: POSITIVE — AB

## 2018-09-08 MED ORDER — M.V.I. ADULT IV INJ
Freq: Once | INTRAVENOUS | Status: AC
  Administered 2018-09-08: 22:00:00 via INTRAVENOUS
  Filled 2018-09-08: qty 1000

## 2018-09-08 MED ORDER — PROMETHAZINE HCL 25 MG RE SUPP: 25.0000 mg | Freq: Four times a day (QID) | RECTAL | 0 refills | Status: DC | PRN

## 2018-09-08 MED ORDER — PROMETHAZINE HCL 25 MG/ML IJ SOLN
25.0000 mg | Freq: Once | INTRAVENOUS | Status: AC
  Administered 2018-09-08: 25 mg via INTRAVENOUS
  Filled 2018-09-08: qty 1

## 2018-09-08 MED ORDER — METOCLOPRAMIDE HCL 10 MG PO TABS: 10.0000 mg | ORAL_TABLET | Freq: Four times a day (QID) | ORAL | 0 refills | Status: DC

## 2018-09-08 NOTE — Telephone Encounter (Signed)
Returned call, left vm.

## 2018-09-08 NOTE — Telephone Encounter (Signed)
Returned call, pt stated that she has been vomiting multiple times for days and is so weak that she cannot stand for long. Pt complains of cramping and back pains. Advised pt to be evaluated at [facility].

## 2018-09-08 NOTE — MAU Provider Note (Signed)
History     CSN: [id number]  Arrival date and time: 09/08/18 9629   First Provider Initiated Contact with Patient 09/08/18 2006      Chief Complaint  Patient presents with  . Abdominal Pain  . Emesis   HPI  Ms.  Faith Woods is a 22 y.o. year old G49P1102 female at [redacted]w[redacted]d weeks gestation who presents to MAU reporting abdominal pain, N/V and unable to keep anything down for ~2 weeks. She was seen at [facility] on 09/01/18 for UPT and was given a Rx for PNV, but not any antiemetics. She reports that she has thrown up so much that she is too weak to stand/walk for too long. She denies any VB or LOF.  Past Medical History:  Diagnosis Date  . Ischemic bowel syndrome [facility])     Past Surgical History:  Procedure Laterality Date  . NO PAST SURGERIES      History reviewed. No pertinent family history.  Social History   Tobacco Use  . Smoking status: Never Smoker  . Smokeless tobacco: Never Used  Substance Use Topics  . Alcohol use: No  . Drug use: No    Allergies: No Known Allergies  Medications Prior to Admission  Medication Sig Dispense Refill Last Dose  . Prenat-FePoly-Metf-FA-DHA-DSS (VITAFOL FE+) 90-1-200 & 50 MG CPPK Take 1 tablet by mouth daily before breakfast. 90 each 3 09/08/2018 at Unknown time  . acetaminophen (TYLENOL) 500 MG tablet Take 500 mg by mouth every 6 (six) hours as needed.   01/15/2018  . clindamycin (CLEOCIN) 300 MG capsule Take 1 capsule (300 mg total) by mouth 3 (three) times daily. (Patient not taking: Reported on 10/18/2016) 21 capsule 0 Not Taking  . ferrous sulfate 325 (65 FE) MG tablet Take 325 mg by mouth daily with breakfast.   01/16/2018  . ibuprofen (ADVIL,MOTRIN) 600 MG tablet Take 1 tablet (600 mg total) by mouth 4 (four) times daily. (Patient not taking: Reported on 01/19/2018) 120 tablet 2 Not Taking at Unknown time  . metroNIDAZOLE (FLAGYL) 500 MG tablet Take 1 tablet (500 mg total) by mouth 2 (two) times daily. 14 tablet 0   . norethindrone  (MICRONOR,CAMILA,ERRIN) 0.35 MG tablet Take 1 tablet (0.35 mg total) by mouth daily. 1 Package 11 01/16/2018  . oxyCODONE-acetaminophen (ROXICET) 5-325 MG tablet Take 1-2 tablets by mouth every 4 (four) hours as needed for severe pain. (Patient not taking: Reported on 10/18/2016) 30 tablet 0 Not Taking    Review of Systems  Constitutional: Positive for fatigue.  HENT: Negative.   Eyes: Negative.   Respiratory: Negative.   Cardiovascular: Negative.   Gastrointestinal: Positive for abdominal pain, nausea and vomiting.  Endocrine: Negative.   Genitourinary: Negative.   Musculoskeletal: Negative.   Skin: Negative.   Allergic/Immunologic: Negative.   Neurological: Positive for weakness.  Hematological: Negative.   Psychiatric/Behavioral: Negative.    Physical Exam   Blood pressure 108/64, pulse 81, temperature 98.7 F (37.1 C), temperature source Oral, resp. rate 18, height 5\' 8"  (1.727 m), weight 85.3 kg, last menstrual period 07/12/2018, SpO2 100 %, currently breastfeeding.  Physical Exam  Vitals reviewed. Constitutional: She is oriented to person, place, and time. She appears well-developed and well-nourished.  HENT:  Head: Normocephalic and atraumatic.  Eyes: Pupils are equal, round, and reactive to light.  Neck: Normal range of motion.  Cardiovascular: Normal rate, regular rhythm, normal heart sounds and intact distal pulses.  Respiratory: Effort normal and breath sounds normal.  GI: Soft. Bowel sounds are normal.  Genitourinary:  Genitourinary Comments: Pelvic deferred  Musculoskeletal: Normal range of motion.  Neurological: She is alert and oriented to person, place, and time. She has normal reflexes.  Skin: Skin is warm and dry.  Psychiatric: She has a normal mood and affect. Her behavior is normal. Judgment and thought content normal.    MAU Course  Procedures  MDM CCUA IVFs: Phenergan 25 mg in LR 1000 ml @ 999 ml/hr; followed by MVI in LR 1000 ml @ 500 ml/hr --  resolve N/V, able to tolerate crackers and gingerale with no N/V  Results for orders placed or performed during the hospital encounter of 09/08/18 (from the past 24 hour(s))  Pregnancy, urine POC     Status: Abnormal   Collection Time: 09/08/18  7:52 PM  Result Value Ref Range   Preg Test, Ur POSITIVE (A) NEGATIVE  Urinalysis, Routine w reflex microscopic     Status: Abnormal   Collection Time: 09/08/18  7:54 PM  Result Value Ref Range   Color, Urine YELLOW YELLOW   APPearance HAZY (A) CLEAR   Specific Gravity, Urine 1.025 1.005 - 1.030   pH 5.0 5.0 - 8.0   Glucose, UA NEGATIVE NEGATIVE mg/dL   Hgb urine dipstick NEGATIVE NEGATIVE   Bilirubin Urine NEGATIVE NEGATIVE   Ketones, ur NEGATIVE NEGATIVE mg/dL   Protein, ur NEGATIVE NEGATIVE mg/dL   Nitrite NEGATIVE NEGATIVE   Leukocytes, UA NEGATIVE NEGATIVE    Assessment and Plan  Nausea/vomiting in pregnancy - Plan: Discharge patient - Rx for Phenergan 25 mg suppositories for N/V at bedtime - Rx for reglan 10 mg every 6 hrs prn N/V  - Information provided on morning sickness, HG and eating plan for HG - Discharge home - Keep scheduled appt with [facility] on 09/23/18 - Patient verbalized an understanding of the plan of care and agrees.   Raelyn Mora, MSN, CNM 09/08/2018, 8:13 PM

## 2018-09-08 NOTE — MAU Note (Signed)
Pt here with c/o abdominal pain and vomiting for the past couple of weeks, feel weak. Denies any bleeding or leaking.

## 2018-09-09 NOTE — Discharge Instructions (Signed)

## 2018-09-23 ENCOUNTER — Encounter: Payer: Self-pay | Admitting: Obstetrics and Gynecology

## 2018-09-23 ENCOUNTER — Ambulatory Visit (INDEPENDENT_AMBULATORY_CARE_PROVIDER_SITE_OTHER): Payer: Medicaid Other | Admitting: Obstetrics and Gynecology

## 2018-09-23 ENCOUNTER — Other Ambulatory Visit (HOSPITAL_COMMUNITY)
Admission: RE | Admit: 2018-09-23 | Discharge: 2018-09-23 | Disposition: A | Discharge status: Home / Self Care | Payer: Medicaid Other | Source: Ambulatory Visit | Attending: Obstetrics and Gynecology | Admitting: Obstetrics and Gynecology

## 2018-09-23 VITALS — BP 146/76 | HR 91 | Wt 188.0 lb

## 2018-09-23 DIAGNOSIS — R030 Elevated blood-pressure reading, without diagnosis of hypertension: Secondary | ICD-10-CM

## 2018-09-23 DIAGNOSIS — Z3481 Encounter for supervision of other normal pregnancy, first trimester: Secondary | ICD-10-CM | POA: Insufficient documentation

## 2018-09-23 DIAGNOSIS — Z3480 Encounter for supervision of other normal pregnancy, unspecified trimester: Secondary | ICD-10-CM | POA: Diagnosis present

## 2018-09-23 DIAGNOSIS — O219 Vomiting of pregnancy, unspecified: Secondary | ICD-10-CM

## 2018-09-23 MED ORDER — PREPLUS 27-1 MG PO TABS: 1.0000 | ORAL_TABLET | Freq: Every day | ORAL | 13 refills | Status: DC

## 2018-09-23 NOTE — Progress Notes (Signed)
INITIAL PRENATAL VISIT NOTE  Subjective:  Faith Woods is a 22 y.o. [id number] at 49w3dby LMP being seen today for her initial prenatal visit. This is an unplanned pregnancy. She and partner are happy with the pregnancy. She was using pills for birth control previously, then stopped pills. She has an obstetric history significant for PPROM and pre-term delivery @ 30 weeks with 1st pregnancy. Did not take Makena with 2nd pregnancy and delivered at 39 weeks. Denies h/o gDM or hypertension in other pregnancies. She has a medical history significant for n/a.  Patient reports fatigue and pressure. Some brownish discharge.  Contractions: Not present. Vag. Bleeding: None.   . Denies leaking of fluid.    Past Medical History:  Diagnosis Date  . Ischemic bowel syndrome ([facility]     Past Surgical History:  Procedure Laterality Date  . NO PAST SURGERIES      OB History  Gravida Para Term Preterm AB Living  _0 SAB TAB Ectopic Multiple Live Births        0 2    # Outcome Date GA Lbr Len/2nd Weight Sex Delivery Anes PTL Lv  3 Current           2 Term 07/15/16 371w5d 00:11 7 lb 9.3 oz (3.44 kg) F Vag-Spont None  LIV     Birth Comments: WNL   1 Preterm 11/16/12 3071w0d lb 3.2 oz (0.544 kg) M Vag-Spont None Y LIV    Social History   Socioeconomic History  . Marital status: Married    Spouse name: Not on file  . Number of children: Not on file  . Years of education: Not on file  . Highest education level: Not on file  Occupational History  . Not on file  Social Needs  . Financial resource strain: Not on file  . Food insecurity:    Worry: Not on file    Inability: Not on file  . Transportation needs:    Medical: Not on file    Non-medical: Not on file  Tobacco Use  . Smoking status: Never Smoker  . Smokeless tobacco: Never Used  Substance and Sexual Activity  . Alcohol use: No  . Drug use: No  . Sexual activity: Yes    Birth control/protection: Pill   Comment: last sex 06 Sep 2018  Lifestyle  . Physical activity:    Days per week: Not on file    Minutes per session: Not on file  . Stress: Not on file  Relationships  . Social connections:    Talks on phone: Not on file    Gets together: Not on file    Attends religious service: Not on file    Active member of club or organization: Not on file    Attends meetings of clubs or organizations: Not on file    Relationship status: Not on file  Other Topics Concern  . Not on file  Social History Narrative  . Not on file    History reviewed. No pertinent family history.   Current Outpatient Medications:  .  Prenat-FeAsp-Meth-FA-DHA w/o A (PRENATE PIXIE) 10-0.6-0.4-200 MG CAPS, Take by mouth., Disp: , Rfl:   No Known Allergies  Review of Systems: Negative except for what is mentioned in HPI.  Objective:   Vitals:   09/23/18 1423  BP: (!) 146/76  Pulse: 91  Weight: 188 lb (85.3 kg)    Fetal Status: Fetal Heart Rate (bpm): 170  Physical Exam: BP (!) 146/76   Pulse 91   Wt 188 lb (85.3 kg)   LMP 07/12/2018   BMI 28.59 kg/m  CONSTITUTIONAL: Well-developed, well-nourished female in no acute distress.  NEUROLOGIC: Alert and oriented to person, place, and time. Normal reflexes, muscle tone coordination. No cranial nerve deficit noted. PSYCHIATRIC: Normal mood and affect. Normal behavior. Normal judgment and thought content. SKIN: Skin is warm and dry. No rash noted. Not diaphoretic. No erythema. No pallor. HENT:  Normocephalic, atraumatic, External right and left ear normal. Oropharynx is clear and moist EYES: Conjunctivae and EOM are normal. Pupils are equal, round, and reactive to light. No scleral icterus.  NECK: Normal range of motion, supple, no masses CARDIOVASCULAR: Normal heart rate noted, regular rhythm RESPIRATORY: Effort and breath sounds normal, no problems with respiration noted BREASTS: symmetric, non-tender, no masses palpable ABDOMEN: Soft, nontender,  nondistended, gravid. GU: normal appearing external female genitalia, multiparous normal appearing cervix, scant white discharge in vagina, no lesions noted Bimanual: 10 weeks sized uterus, no adnexal tenderness or palpable lesions noted MUSCULOSKELETAL: Normal range of motion. EXT:  No edema and no tenderness. 2+ distal pulses.   Assessment and Plan:  Pregnancy: G3P1102 at 40w3dby LMP  1. Supervision of other normal pregnancy, antepartum - Cytology - PAP( [place]) - Obstetric Panel, Including HIV - Genetic Screening - Culture, OB Urine - SMN1 Copy Number Analysis - Cystic Fibrosis Mutation 97 - Hemoglobinopathy evaluation  2. Nausea/vomiting in pregnancy Improved  3. Elevated blood-pressure reading, without diagnosis of hypertension - No h/o elevated BP prior to today, reviewed last several visits with ED and last pregnancy - Will cont to monitor, if BP elevated next visit, likely cHTN - Protein / creatinine ratio, urine - Comp Met (CMET)    Preterm labor symptoms and general obstetric precautions including but not limited to vaginal bleeding, contractions, leaking of fluid and fetal movement were reviewed in detail with the patient.  Please refer to After Visit Summary for other counseling recommendations.   Return in about 4 weeks (around 10/21/2018) for OB visit (MD).  KSloan Leiter10/15/2019 3:26 PM

## 2018-09-24 LAB — CYTOLOGY - PAP
CHLAMYDIA, DNA PROBE: NEGATIVE
DIAGNOSIS: NEGATIVE
Neisseria Gonorrhea: NEGATIVE

## 2018-09-24 LAB — PROTEIN / CREATININE RATIO, URINE
Creatinine, Urine: 216.3 mg/dL
Protein, Ur: 14.8 mg/dL
Protein/Creat Ratio: 68 mg/g creat (ref 0–200)

## 2018-09-26 LAB — OBSTETRIC PANEL, INCLUDING HIV
ANTIBODY SCREEN: NEGATIVE
BASOS: 1 %
Basophils Absolute: 0 10*3/uL (ref 0.0–0.2)
EOS (ABSOLUTE): 0.1 10*3/uL (ref 0.0–0.4)
EOS: 1 %
HEMATOCRIT: 37 % (ref 34.0–46.6)
HEMOGLOBIN: 12.3 g/dL (ref 11.1–15.9)
HIV SCREEN 4TH GENERATION: NONREACTIVE
Hepatitis B Surface Ag: NEGATIVE
Immature Grans (Abs): 0 10*3/uL (ref 0.0–0.1)
Immature Granulocytes: 0 %
Lymphocytes Absolute: 2 10*3/uL (ref 0.7–3.1)
Lymphs: 28 %
MCH: 29.4 pg (ref 26.6–33.0)
MCHC: 33.2 g/dL (ref 31.5–35.7)
MCV: 88 fL (ref 79–97)
MONOCYTES: 11 %
Monocytes Absolute: 0.7 10*3/uL (ref 0.1–0.9)
NEUTROS ABS: 4.2 10*3/uL (ref 1.4–7.0)
Neutrophils: 59 %
Platelets: 230 10*3/uL (ref 150–450)
RBC: 4.19 x10E6/uL (ref 3.77–5.28)
RDW: 13 % (ref 12.3–15.4)
RH TYPE: POSITIVE
RPR: NONREACTIVE
RUBELLA: 2.6 {index} (ref >0.99)
WBC: 7 10*3/uL (ref 3.4–10.8)

## 2018-09-26 LAB — HEMOGLOBINOPATHY EVALUATION
HEMOGLOBIN A2 QUANTITATION: 2.6 % (ref 1.8–3.2)
HGB C: 0 %
HGB S: 0 %
HGB VARIANT: 0 %
Hemoglobin F Quantitation: 0 % (ref 0.0–2.0)
Hgb A: 97.4 % (ref 96.4–98.8)

## 2018-09-26 LAB — COMPREHENSIVE METABOLIC PANEL
A/G RATIO: 1.8 (ref 1.2–2.2)
ALBUMIN: 4.4 g/dL (ref 3.5–5.5)
ALT: 9 IU/L (ref 0–32)
AST: 17 IU/L (ref 0–40)
Alkaline Phosphatase: 48 IU/L (ref 39–117)
BILIRUBIN TOTAL: 0.4 mg/dL (ref 0.0–1.2)
BUN / CREAT RATIO: 12 (ref 9–23)
BUN: 6 mg/dL (ref 6–20)
CHLORIDE: 102 mmol/L (ref 96–106)
CO2: 20 mmol/L (ref 20–29)
Calcium: 9.7 mg/dL (ref 8.7–10.2)
Creatinine, Ser: 0.52 mg/dL — ABNORMAL LOW (ref 0.57–1.00)
GFR calc non Af Amer: 135 mL/min/{1.73_m2} (ref >59)
GFR, EST AFRICAN AMERICAN: 156 mL/min/{1.73_m2} (ref >59)
GLUCOSE: 77 mg/dL (ref 65–99)
Globulin, Total: 2.5 g/dL (ref 1.5–4.5)
POTASSIUM: 4.1 mmol/L (ref 3.5–5.2)
Sodium: 137 mmol/L (ref 134–144)
Total Protein: 6.9 g/dL (ref 6.0–8.5)

## 2018-09-27 LAB — URINE CULTURE, OB REFLEX: Organism ID, Bacteria: NO GROWTH

## 2018-09-27 LAB — CULTURE, OB URINE

## 2018-09-29 ENCOUNTER — Encounter: Payer: Self-pay | Admitting: Obstetrics and Gynecology

## 2018-10-01 LAB — SMN1 COPY NUMBER ANALYSIS (SMA CARRIER SCREENING)

## 2018-10-02 LAB — CYSTIC FIBROSIS MUTATION 97: Interpretation: NOT DETECTED

## 2018-10-21 ENCOUNTER — Encounter: Payer: Self-pay | Admitting: Obstetrics and Gynecology

## 2018-10-21 ENCOUNTER — Ambulatory Visit (INDEPENDENT_AMBULATORY_CARE_PROVIDER_SITE_OTHER): Payer: Medicaid Other | Admitting: Obstetrics and Gynecology

## 2018-10-21 VITALS — BP 117/72 | HR 100 | Wt 188.0 lb

## 2018-10-21 DIAGNOSIS — Z3480 Encounter for supervision of other normal pregnancy, unspecified trimester: Secondary | ICD-10-CM

## 2018-10-21 DIAGNOSIS — Z3482 Encounter for supervision of other normal pregnancy, second trimester: Secondary | ICD-10-CM

## 2018-10-21 NOTE — Progress Notes (Signed)
   PRENATAL VISIT NOTE  Subjective:  Faith Woods is a 22 y.o. [id number] at 694w3d being seen today for ongoing prenatal care.  She is currently monitored for the following issues for this low-risk pregnancy and has Nausea/vomiting in pregnancy and Supervision of other normal pregnancy, antepartum on their problem list.  Patient reports no complaints.  Contractions: Not present. Vag. Bleeding: None.   . Denies leaking of fluid.   The following portions of the patient's history were reviewed and updated as appropriate: allergies, current medications, past family history, past medical history, past social history, past surgical history and problem list. Problem list updated.  Objective:   Vitals:   10/21/18 1410  BP: 117/72  Pulse: 100  Weight: 188 lb (85.3 kg)    Fetal Status: Fetal Heart Rate (bpm): 155         General:  Alert, oriented and cooperative. Patient is in no acute distress.  Skin: Skin is warm and dry. No rash noted.   Cardiovascular: Normal heart rate noted  Respiratory: Normal respiratory effort, no problems with respiration noted  Abdomen: Soft, gravid, appropriate for gestational age.  Pain/Pressure: Present     Pelvic: Cervical exam deferred        Extremities: Normal range of motion.     Mental Status: Normal mood and affect. Normal behavior. Normal judgment and thought content.   Assessment and Plan:  Pregnancy: G3P1102 at 794w3d  1. Supervision of other normal pregnancy, antepartum Patient is doing well Anatomy ultrasound ordered  Preterm labor symptoms and general obstetric precautions including but not limited to vaginal bleeding, contractions, leaking of fluid and fetal movement were reviewed in detail with the patient. Please refer to After Visit Summary for other counseling recommendations.  Return in about 4 weeks (around 11/18/2018) for ROB.  No future appointments.  Catalina AntiguaPeggy Lige Lakeman, MD

## 2018-10-21 NOTE — Progress Notes (Signed)
Pt states she is having some lower pelvic/groin pain.

## 2018-11-03 ENCOUNTER — Inpatient Hospital Stay (HOSPITAL_COMMUNITY)
Admission: AD | Admit: 2018-11-03 | Discharge: 2018-11-03 | Disposition: A | Discharge status: Home / Self Care | Payer: Medicaid Other | Source: Ambulatory Visit | Attending: Obstetrics and Gynecology | Admitting: Obstetrics and Gynecology

## 2018-11-03 ENCOUNTER — Other Ambulatory Visit: Payer: Self-pay

## 2018-11-03 DIAGNOSIS — O98812 Other maternal infectious and parasitic diseases complicating pregnancy, second trimester: Secondary | ICD-10-CM | POA: Insufficient documentation

## 2018-11-03 DIAGNOSIS — O26892 Other specified pregnancy related conditions, second trimester: Secondary | ICD-10-CM | POA: Diagnosis not present

## 2018-11-03 DIAGNOSIS — N898 Other specified noninflammatory disorders of vagina: Secondary | ICD-10-CM | POA: Diagnosis not present

## 2018-11-03 DIAGNOSIS — B3731 Acute candidiasis of vulva and vagina: Secondary | ICD-10-CM

## 2018-11-03 DIAGNOSIS — Z3A16 16 weeks gestation of pregnancy: Secondary | ICD-10-CM | POA: Insufficient documentation

## 2018-11-03 DIAGNOSIS — B373 Candidiasis of vulva and vagina: Secondary | ICD-10-CM | POA: Diagnosis not present

## 2018-11-03 LAB — URINALYSIS, ROUTINE W REFLEX MICROSCOPIC
BILIRUBIN URINE: NEGATIVE
GLUCOSE, UA: NEGATIVE mg/dL
HGB URINE DIPSTICK: NEGATIVE
KETONES UR: NEGATIVE mg/dL
NITRITE: NEGATIVE
PH: 6 (ref 5.0–8.0)
PROTEIN: NEGATIVE mg/dL
Specific Gravity, Urine: 1.005 (ref 1.005–1.030)

## 2018-11-03 LAB — WET PREP, GENITAL
Sperm: NONE SEEN
Trich, Wet Prep: NONE SEEN

## 2018-11-03 MED ORDER — TERCONAZOLE 0.4 % VA CREA: 1.0000 | TOPICAL_CREAM | Freq: Every day | VAGINAL | 1 refills | Status: DC

## 2018-11-03 NOTE — MAU Provider Note (Signed)
Chief Complaint  Patient presents with  . Vaginal Discharge  . Vaginal Pain      First Provider Initiated Contact with Patient 11/03/18 2104      HPI: Faith Woods is a 22 y.o. year old 563P1102 female at 3165w2d weeks gestation who presents to MAU reporting vaginal irritation, itching and thick white discharge.  Has tried the following treatments: None  Associated Sx. Neg for fever, chills, LOF, VB, odor, abd pain  Past Medical History:  Diagnosis Date  . Ischemic bowel syndrome ([facility])     ROS: Constitutional: neg Fever or chills GI: Neg Abdominal pain GU: Pos Vaginal discharge, neg vaginal bleeding or dyspareunia  PHYSICAL EXAM Patient Vitals for the past 24 hrs:  BP Temp Temp src Pulse Resp SpO2 Weight  11/03/18 1842 111/67 98.5 F (36.9 C) Oral 81 16 100 % 86.6 kg   Physical Exam  Constitutional: She is oriented to person, place, and time. She appears well-developed and well-nourished.  Genitourinary:  Genitourinary Comments: Blind wet prep, GC/CHlamydia collected.   Cardiovascular: Normal rate.  Pulmonary/Chest: Effort normal.  Neurological: She is alert and oriented to person, place, and time.    MAU COURSE Orders Placed This Encounter  Procedures  . Wet prep, genital  . Urinalysis, Routine w reflex microscopic  . Discharge patient   Meds ordered this encounter  Medications  . terconazole (TERAZOL 7) 0.4 % vaginal cream    Sig: Place 1 applicator vaginally at bedtime.    Dispense:  45 g    Refill:  1    Order Specific Question:   Supervising Provider    Answer:   Duane LopeEURE, LUTHER H [[id number]]    MDM - Dx VVC. Tx Terazol.   ASSESSMENT 1. Vaginal yeast infection   2. Vaginal discharge during pregnancy in second trimester      PLAN Discharge home in stable condition GC/Chlamydia pending. Follow-up Information    [facility] up on 11/18/2018.   Why:  as scheduled or sooner as needed if symptoms worsen Contact information: [address] [place] [phone]       [facility] Follow up.   Why:  in pregnancy emergencies Contact information: [address] [id number] mc [place] [id number] [phone]         Allergies as of 11/03/2018   No Known Allergies     Medication List    STOP taking these medications   PRENATE PIXIE 10-0.6-0.4-200 MG Caps     TAKE these medications   PREPLUS 27-1 MG Tabs Take 1 tablet by mouth daily.   terconazole 0.4 % vaginal cream Commonly known as:  TERAZOL 7 Place 1 applicator vaginally at bedtime.       Katrinka BlazingSmith, [place], [place] 11/03/2018 8:56 PM

## 2018-11-03 NOTE — Discharge Instructions (Signed)

## 2018-11-03 NOTE — MAU Note (Signed)
Is 7064w2d. Having a vag infection. Vag swelling, white clumpy cheese like d/c.  Is very sore. Can't sleep, it really itches her.

## 2018-11-04 LAB — GC/CHLAMYDIA PROBE AMP (~~LOC~~) NOT AT ARMC
Chlamydia: NEGATIVE
NEISSERIA GONORRHEA: NEGATIVE

## 2018-11-11 ENCOUNTER — Encounter (HOSPITAL_COMMUNITY): Payer: Self-pay

## 2018-11-18 ENCOUNTER — Other Ambulatory Visit: Payer: Self-pay

## 2018-11-18 ENCOUNTER — Ambulatory Visit (HOSPITAL_COMMUNITY)
Admission: RE | Admit: 2018-11-18 | Discharge: 2018-11-18 | Disposition: A | Discharge status: Home / Self Care | Payer: Medicaid Other | Source: Ambulatory Visit | Attending: Obstetrics and Gynecology | Admitting: Obstetrics and Gynecology

## 2018-11-18 ENCOUNTER — Encounter: Payer: Self-pay | Admitting: Obstetrics and Gynecology

## 2018-11-18 ENCOUNTER — Other Ambulatory Visit: Payer: Self-pay | Admitting: Obstetrics and Gynecology

## 2018-11-18 ENCOUNTER — Other Ambulatory Visit (HOSPITAL_COMMUNITY): Payer: Self-pay | Admitting: *Deleted

## 2018-11-18 ENCOUNTER — Ambulatory Visit (INDEPENDENT_AMBULATORY_CARE_PROVIDER_SITE_OTHER): Payer: Medicaid Other | Admitting: Obstetrics and Gynecology

## 2018-11-18 DIAGNOSIS — O09219 Supervision of pregnancy with history of pre-term labor, unspecified trimester: Secondary | ICD-10-CM

## 2018-11-18 DIAGNOSIS — O09212 Supervision of pregnancy with history of pre-term labor, second trimester: Secondary | ICD-10-CM | POA: Diagnosis not present

## 2018-11-18 DIAGNOSIS — Z3480 Encounter for supervision of other normal pregnancy, unspecified trimester: Secondary | ICD-10-CM

## 2018-11-18 DIAGNOSIS — Z3482 Encounter for supervision of other normal pregnancy, second trimester: Secondary | ICD-10-CM

## 2018-11-18 DIAGNOSIS — O09213 Supervision of pregnancy with history of pre-term labor, third trimester: Secondary | ICD-10-CM | POA: Diagnosis not present

## 2018-11-18 DIAGNOSIS — Z363 Encounter for antenatal screening for malformations: Secondary | ICD-10-CM | POA: Insufficient documentation

## 2018-11-18 DIAGNOSIS — Z3A18 18 weeks gestation of pregnancy: Secondary | ICD-10-CM | POA: Insufficient documentation

## 2018-11-18 DIAGNOSIS — O09899 Supervision of other high risk pregnancies, unspecified trimester: Secondary | ICD-10-CM

## 2018-11-18 DIAGNOSIS — O368990 Maternal care for other specified fetal problems, unspecified trimester, not applicable or unspecified: Secondary | ICD-10-CM

## 2018-11-18 HISTORY — DX: Supervision of other high risk pregnancies, unspecified trimester: O09.899

## 2018-11-18 MED ORDER — FLUCONAZOLE 150 MG PO TABS: 150.0000 mg | ORAL_TABLET | Freq: Once | ORAL | 0 refills | Status: AC

## 2018-11-18 NOTE — Addendum Note (Signed)
Addended by: Catalina AntiguaONSTANT, Sayuri Rhames on: 11/18/2018 02:43 PM   Modules accepted: Orders

## 2018-11-18 NOTE — Progress Notes (Signed)
   PRENATAL VISIT NOTE  Subjective:  Faith Woods is a 22 y.o. [id number] at 7340w3d being seen today for ongoing prenatal care.  She is currently monitored for the following issues for this high-risk pregnancy and has Nausea/vomiting in pregnancy; Supervision of other normal pregnancy, antepartum; and History of preterm delivery, currently pregnant on their problem list.  Patient reports no complaints.  Contractions: Not present. Vag. Bleeding: None.  Movement: Present. Denies leaking of fluid.   The following portions of the patient's history were reviewed and updated as appropriate: allergies, current medications, past family history, past medical history, past social history, past surgical history and problem list. Problem list updated.  Objective:   Vitals:   11/18/18 1356  BP: 106/61  Pulse: 95  Weight: 192 lb 4.8 oz (87.2 kg)    Fetal Status: Fetal Heart Rate (bpm): 152   Movement: Present     General:  Alert, oriented and cooperative. Patient is in no acute distress.  Skin: Skin is warm and dry. No rash noted.   Cardiovascular: Normal heart rate noted  Respiratory: Normal respiratory effort, no problems with respiration noted  Abdomen: Soft, gravid, appropriate for gestational age.  Pain/Pressure: Present     Pelvic: Cervical exam deferred        Extremities: Normal range of motion.  Edema: None  Mental Status: Normal mood and affect. Normal behavior. Normal judgment and thought content.   Assessment and Plan:  Pregnancy: G3P1102 at 1640w3d  1. Supervision of other normal pregnancy, antepartum Patient is doing well without complaints Reviewed ultrasound report Follow up ultrasound scheduled AFP today - AFP, Serum, Open Spina Bifida  2. History of preterm delivery, currently pregnant Patient reports onset of preterm labor with first pregnancy. Patient did not receive 17-P with second pregnancy Patient declines 17-P this pregnancy  Preterm labor symptoms and general  obstetric precautions including but not limited to vaginal bleeding, contractions, leaking of fluid and fetal movement were reviewed in detail with the patient. Please refer to After Visit Summary for other counseling recommendations.  Return in about 4 weeks (around 12/16/2018) for ROB.  Future Appointments  Date Time Provider Department Center  12/16/2018 11:00 AM [facility] US 3 [facility]    Catalina AntiguaPeggy Wilbert Hayashi, MD

## 2018-11-20 LAB — AFP, SERUM, OPEN SPINA BIFIDA
AFP MoM: 2.71
AFP Value: 114.8 ng/mL
Gest. Age on Collection Date: 18.3 weeks
Maternal Age At EDD: 24.2 yr
OSBR RISK 1 IN: 390
TEST RESULTS AFP: NEGATIVE
Weight: 192 [lb_av]

## 2018-12-10 NOTE — L&D Delivery Note (Addendum)
Delivery Note After pt received an epidural, she had an urge to push. After a 20 minute 2nd stage, at 1:58 PM a viable female was delivered via Vaginal, Spontaneous (Presentation:ROA ).  APGAR: 9, 9; weight  Pending. After 1 minute, the cord was clamped and cut. 40 units of pitocin diluted in 1000cc LR was infused rapidly IV.  The placenta separated spontaneously and delivered via CCT and maternal pushing effort.  It was inspected and appears to be intact with a 3 VC. The umbilical cord is deformed about 2 cms from umbilicus, presumably where the cyst (found to be resolved) was earlier in pregnancy. Sent to pathology.    Anesthesia: epidural Episiotomy: None Lacerations: None Suture Repair:  Est. Blood Loss (mL): 409 [phone]  Mom to postpartum.  Baby to Couplet care / Skin to Skin.  Faith Woods 04/18/2019, 2:25 PM  Addendum:  Called to room twice within 2 hours of delivery for extra bleeding. Both times, clots removed from LUS.  FF at U, bladder emptied, VSS.  Cytotec PR and extra Pitocin infusion.  Stable now.

## 2018-12-16 ENCOUNTER — Other Ambulatory Visit (HOSPITAL_COMMUNITY): Payer: Self-pay | Admitting: *Deleted

## 2018-12-16 ENCOUNTER — Encounter (HOSPITAL_COMMUNITY): Payer: Self-pay

## 2018-12-16 ENCOUNTER — Ambulatory Visit (HOSPITAL_COMMUNITY)
Admission: RE | Admit: 2018-12-16 | Discharge: 2018-12-16 | Disposition: A | Discharge status: Home / Self Care | Payer: Medicaid Other | Source: Ambulatory Visit | Attending: Obstetrics and Gynecology | Admitting: Obstetrics and Gynecology

## 2018-12-16 DIAGNOSIS — O219 Vomiting of pregnancy, unspecified: Secondary | ICD-10-CM | POA: Diagnosis present

## 2018-12-16 DIAGNOSIS — O09899 Supervision of other high risk pregnancies, unspecified trimester: Secondary | ICD-10-CM

## 2018-12-16 DIAGNOSIS — O368990 Maternal care for other specified fetal problems, unspecified trimester, not applicable or unspecified: Secondary | ICD-10-CM | POA: Diagnosis not present

## 2018-12-16 DIAGNOSIS — Z362 Encounter for other antenatal screening follow-up: Secondary | ICD-10-CM

## 2018-12-16 DIAGNOSIS — Z3A22 22 weeks gestation of pregnancy: Secondary | ICD-10-CM

## 2018-12-16 DIAGNOSIS — O09212 Supervision of pregnancy with history of pre-term labor, second trimester: Secondary | ICD-10-CM | POA: Diagnosis not present

## 2018-12-16 DIAGNOSIS — O09219 Supervision of pregnancy with history of pre-term labor, unspecified trimester: Secondary | ICD-10-CM | POA: Insufficient documentation

## 2018-12-17 ENCOUNTER — Encounter: Payer: Self-pay | Admitting: Obstetrics and Gynecology

## 2018-12-17 ENCOUNTER — Telehealth: Payer: Self-pay | Admitting: *Deleted

## 2018-12-17 ENCOUNTER — Ambulatory Visit (INDEPENDENT_AMBULATORY_CARE_PROVIDER_SITE_OTHER): Payer: Medicaid Other | Admitting: Obstetrics and Gynecology

## 2018-12-17 VITALS — BP 104/68 | HR 94 | Wt 198.7 lb

## 2018-12-17 DIAGNOSIS — Z3A22 22 weeks gestation of pregnancy: Secondary | ICD-10-CM

## 2018-12-17 DIAGNOSIS — Z3480 Encounter for supervision of other normal pregnancy, unspecified trimester: Secondary | ICD-10-CM

## 2018-12-17 DIAGNOSIS — O09219 Supervision of pregnancy with history of pre-term labor, unspecified trimester: Principal | ICD-10-CM

## 2018-12-17 DIAGNOSIS — Z3482 Encounter for supervision of other normal pregnancy, second trimester: Secondary | ICD-10-CM

## 2018-12-17 DIAGNOSIS — O09899 Supervision of other high risk pregnancies, unspecified trimester: Secondary | ICD-10-CM

## 2018-12-17 DIAGNOSIS — O09212 Supervision of pregnancy with history of pre-term labor, second trimester: Secondary | ICD-10-CM

## 2018-12-17 NOTE — Telephone Encounter (Signed)
Pt called to office stating she has cold symptoms and did not mention at appt today. Return call to pt. Pt made aware of OTC meds that she may take during pregnancy. Pt advised to contact office if symptoms persist or do not resolve.

## 2018-12-17 NOTE — Progress Notes (Signed)
   PRENATAL VISIT NOTE  Subjective:  Faith Woods is a 23 y.o. G3P1102 at [redacted]w[redacted]d being seen today for ongoing prenatal care.  She is currently monitored for the following issues for this low-risk pregnancy and has Nausea/vomiting in pregnancy; Supervision of other normal pregnancy, antepartum; and History of preterm delivery, currently pregnant on their problem list.  Patient reports no complaints.  Contractions: Not present. Vag. Bleeding: None.  Movement: Present. Denies leaking of fluid.   The following portions of the patient's history were reviewed and updated as appropriate: allergies, current medications, past family history, past medical history, past social history, past surgical history and problem list. Problem list updated.  Objective:   Vitals:   12/17/18 1355  BP: 104/68  Pulse: 94  Weight: 198 lb 11.2 oz (90.1 kg)    Fetal Status: Fetal Heart Rate (bpm): 153 Fundal Height: 23 cm Movement: Present     General:  Alert, oriented and cooperative. Patient is in no acute distress.  Skin: Skin is warm and dry. No rash noted.   Cardiovascular: Normal heart rate noted  Respiratory: Normal respiratory effort, no problems with respiration noted  Abdomen: Soft, gravid, appropriate for gestational age.  Pain/Pressure: Absent     Pelvic: Cervical exam deferred        Extremities: Normal range of motion.  Edema: None  Mental Status: Normal mood and affect. Normal behavior. Normal judgment and thought content.   Assessment and Plan:  Pregnancy: G3P1102 at [redacted]w[redacted]d  1. Supervision of other normal pregnancy, antepartum Patient is doing well without complaints Third trimester labs next visit Follow up anatomy ultrasound scheduled in February  2. History of preterm delivery, currently pregnant Declined weekly 17-P  Preterm labor symptoms and general obstetric precautions including but not limited to vaginal bleeding, contractions, leaking of fluid and fetal movement were  reviewed in detail with the patient. Please refer to After Visit Summary for other counseling recommendations.  Return in about 4 weeks (around 01/14/2019) for ROB, 2 hr glucola next visit.  Future Appointments  Date Time Provider Department Center  01/27/2019 11:00 AM [facility] [place] 3 [facility]    Catalina Antigua, MD

## 2018-12-17 NOTE — Progress Notes (Signed)
Patient reports fetal movement, denies pain. 

## 2019-01-16 ENCOUNTER — Other Ambulatory Visit: Payer: Medicaid Other

## 2019-01-16 ENCOUNTER — Encounter: Payer: Medicaid Other | Admitting: Obstetrics & Gynecology

## 2019-01-21 ENCOUNTER — Other Ambulatory Visit (HOSPITAL_COMMUNITY)
Admission: RE | Admit: 2019-01-21 | Discharge: 2019-01-21 | Disposition: A | Discharge status: Home / Self Care | Payer: Medicaid Other | Source: Ambulatory Visit | Attending: Nurse Practitioner | Admitting: Nurse Practitioner

## 2019-01-21 ENCOUNTER — Other Ambulatory Visit: Payer: Medicaid Other

## 2019-01-21 ENCOUNTER — Ambulatory Visit (INDEPENDENT_AMBULATORY_CARE_PROVIDER_SITE_OTHER): Payer: Medicaid Other | Admitting: Nurse Practitioner

## 2019-01-21 ENCOUNTER — Encounter: Payer: Self-pay | Admitting: Nurse Practitioner

## 2019-01-21 VITALS — BP 108/69 | HR 94 | Wt 205.0 lb

## 2019-01-21 DIAGNOSIS — O09212 Supervision of pregnancy with history of pre-term labor, second trimester: Secondary | ICD-10-CM

## 2019-01-21 DIAGNOSIS — O09899 Supervision of other high risk pregnancies, unspecified trimester: Secondary | ICD-10-CM

## 2019-01-21 DIAGNOSIS — O368920 Maternal care for other specified fetal problems, second trimester, not applicable or unspecified: Secondary | ICD-10-CM

## 2019-01-21 DIAGNOSIS — N898 Other specified noninflammatory disorders of vagina: Secondary | ICD-10-CM | POA: Diagnosis not present

## 2019-01-21 DIAGNOSIS — O368990 Maternal care for other specified fetal problems, unspecified trimester, not applicable or unspecified: Secondary | ICD-10-CM | POA: Insufficient documentation

## 2019-01-21 DIAGNOSIS — Z3480 Encounter for supervision of other normal pregnancy, unspecified trimester: Secondary | ICD-10-CM

## 2019-01-21 DIAGNOSIS — O09219 Supervision of pregnancy with history of pre-term labor, unspecified trimester: Secondary | ICD-10-CM

## 2019-01-21 DIAGNOSIS — Z3A27 27 weeks gestation of pregnancy: Secondary | ICD-10-CM

## 2019-01-21 DIAGNOSIS — Z3482 Encounter for supervision of other normal pregnancy, second trimester: Secondary | ICD-10-CM

## 2019-01-21 MED ORDER — TERCONAZOLE 0.4 % VA CREA: 1.0000 | TOPICAL_CREAM | Freq: Every day | VAGINAL | 1 refills | Status: DC

## 2019-01-21 NOTE — Progress Notes (Signed)
    Subjective:  Faith Woods is a 23 y.o. [id number] at 3745w4d being seen today for ongoing prenatal care.  She is currently monitored for the following issues for this low-risk pregnancy and has Nausea/vomiting in pregnancy; Supervision of other normal pregnancy, antepartum; and History of preterm delivery, currently pregnant on their problem list.  Patient reports vaginal discharge that she thinks is yeast.  Has internal and external itching. Contractions: Not present. Vag. Bleeding: None.  Movement: Present. Denies leaking of fluid.   The following portions of the patient's history were reviewed and updated as appropriate: allergies, current medications, past family history, past medical history, past social history, past surgical history and problem list. Problem list updated.  Objective:   Vitals:   01/21/19 0826  BP: 108/69  Pulse: 94  Weight: 205 lb (93 kg)    Fetal Status: Fetal Heart Rate (bpm): 140 Fundal Height: 28 cm Movement: Present     General:  Alert, oriented and cooperative. Patient is in no acute distress.  Skin: Skin is warm and dry. No rash noted.   Cardiovascular: Normal heart rate noted  Respiratory: Normal respiratory effort, no problems with respiration noted  Abdomen: Soft, gravid, appropriate for gestational age. Pain/Pressure: Present     Pelvic:  Cervical exam deferred        Extremities: Normal range of motion.  Edema: Trace  Mental Status: Normal mood and affect. Normal behavior. Normal judgment and thought content.   Urinalysis:      Assessment and Plan:  Pregnancy: G3P1102 at 8145w4d  1. Supervision of other normal pregnancy, antepartum Encouraged to have a tour of the new birthing facility  - Glucose Tolerance, 2 Hours w/1 Hour - CBC - RPR - HIV Antibody (routine testing w rflx)  2. History of preterm delivery, currently pregnant Declined 17P    3. Vaginal discharge Terazol cream reordered for her - Cervicovaginal ancillary only(  [facility])  Preterm labor symptoms and general obstetric precautions including but not limited to vaginal bleeding, contractions, leaking of fluid and fetal movement were reviewed in detail with the patient. Please refer to After Visit Summary for other counseling recommendations.  Return in about 2 weeks (around 02/04/2019).  Nolene BernheimERRI BURLESON, RN, MSN, NP-BC Nurse Practitioner, [facility], [facility] 01/21/2019 8:45 AM

## 2019-01-21 NOTE — Progress Notes (Signed)
CC: vaginal discharge w/ itching and  burning.

## 2019-01-21 NOTE — Patient Instructions (Signed)

## 2019-01-22 LAB — CBC
HEMOGLOBIN: 11.3 g/dL (ref 11.1–15.9)
Hematocrit: 32.8 % — ABNORMAL LOW (ref 34.0–46.6)
MCH: 31.6 pg (ref 26.6–33.0)
MCHC: 34.5 g/dL (ref 31.5–35.7)
MCV: 92 fL (ref 79–97)
Platelets: 171 10*3/uL (ref 150–450)
RBC: 3.58 x10E6/uL — ABNORMAL LOW (ref 3.77–5.28)
RDW: 13 % (ref 11.7–15.4)
WBC: 9.2 10*3/uL (ref 3.4–10.8)

## 2019-01-22 LAB — RPR: RPR: NONREACTIVE

## 2019-01-22 LAB — CERVICOVAGINAL ANCILLARY ONLY
Bacterial vaginitis: NEGATIVE
CHLAMYDIA, DNA PROBE: NEGATIVE
Candida vaginitis: POSITIVE — AB
NEISSERIA GONORRHEA: NEGATIVE
Trichomonas: NEGATIVE

## 2019-01-22 LAB — GLUCOSE TOLERANCE, 2 HOURS W/ 1HR
Glucose, 1 hour: 150 mg/dL (ref 65–179)
Glucose, 2 hour: 100 mg/dL (ref 65–152)
Glucose, Fasting: 84 mg/dL (ref 65–91)

## 2019-01-22 LAB — HIV ANTIBODY (ROUTINE TESTING W REFLEX): HIV Screen 4th Generation wRfx: NONREACTIVE

## 2019-01-27 ENCOUNTER — Ambulatory Visit (HOSPITAL_COMMUNITY)
Admission: RE | Admit: 2019-01-27 | Discharge: 2019-01-27 | Disposition: A | Discharge status: Home / Self Care | Payer: Medicaid Other | Source: Ambulatory Visit | Attending: Obstetrics and Gynecology | Admitting: Obstetrics and Gynecology

## 2019-01-27 ENCOUNTER — Encounter (HOSPITAL_COMMUNITY): Payer: Self-pay

## 2019-01-27 DIAGNOSIS — Z362 Encounter for other antenatal screening follow-up: Secondary | ICD-10-CM | POA: Diagnosis not present

## 2019-01-27 DIAGNOSIS — O09213 Supervision of pregnancy with history of pre-term labor, third trimester: Secondary | ICD-10-CM | POA: Diagnosis not present

## 2019-01-27 DIAGNOSIS — Z3A28 28 weeks gestation of pregnancy: Secondary | ICD-10-CM | POA: Diagnosis not present

## 2019-01-27 NOTE — ED Notes (Signed)
Pt reports leaking a small amount of watery discharge x 2 weeks.  Currently being treated for yeast.

## 2019-01-28 ENCOUNTER — Other Ambulatory Visit (HOSPITAL_COMMUNITY): Payer: Self-pay | Admitting: *Deleted

## 2019-01-28 DIAGNOSIS — O368990 Maternal care for other specified fetal problems, unspecified trimester, not applicable or unspecified: Secondary | ICD-10-CM

## 2019-02-11 ENCOUNTER — Encounter: Payer: Self-pay | Admitting: Obstetrics and Gynecology

## 2019-02-11 ENCOUNTER — Ambulatory Visit (INDEPENDENT_AMBULATORY_CARE_PROVIDER_SITE_OTHER): Payer: Medicaid Other | Admitting: Obstetrics and Gynecology

## 2019-02-11 ENCOUNTER — Other Ambulatory Visit: Payer: Self-pay

## 2019-02-11 VITALS — BP 107/69 | HR 96 | Wt 210.0 lb

## 2019-02-11 DIAGNOSIS — O09899 Supervision of other high risk pregnancies, unspecified trimester: Secondary | ICD-10-CM

## 2019-02-11 DIAGNOSIS — O09213 Supervision of pregnancy with history of pre-term labor, third trimester: Secondary | ICD-10-CM

## 2019-02-11 DIAGNOSIS — O09219 Supervision of pregnancy with history of pre-term labor, unspecified trimester: Secondary | ICD-10-CM

## 2019-02-11 DIAGNOSIS — Z3480 Encounter for supervision of other normal pregnancy, unspecified trimester: Secondary | ICD-10-CM

## 2019-02-11 DIAGNOSIS — Z3A30 30 weeks gestation of pregnancy: Secondary | ICD-10-CM

## 2019-02-11 NOTE — Progress Notes (Signed)
   PRENATAL VISIT NOTE  Subjective:  Faith Woods is a 23 y.o. [id number] at [redacted]w[redacted]d being seen today for ongoing prenatal care.  She is currently monitored for the following issues for this low-risk pregnancy and has Nausea/vomiting in pregnancy; Supervision of other normal pregnancy, antepartum; History of preterm delivery, currently pregnant; and Umbilical cord cyst during pregnancy, antepartum on their problem list.  Patient reports no complaints.  Contractions: Irregular. Vag. Bleeding: None.  Movement: Present. Denies leaking of fluid.   The following portions of the patient's history were reviewed and updated as appropriate: allergies, current medications, past family history, past medical history, past social history, past surgical history and problem list. Problem list updated.  Objective:   Vitals:   02/11/19 1037  BP: 107/69  Pulse: 96  Weight: 210 lb (95.3 kg)    Fetal Status: Fetal Heart Rate (bpm): 140 Fundal Height: 30 cm Movement: Present     General:  Alert, oriented and cooperative. Patient is in no acute distress.  Skin: Skin is warm and dry. No rash noted.   Cardiovascular: Normal heart rate noted  Respiratory: Normal respiratory effort, no problems with respiration noted  Abdomen: Soft, gravid, appropriate for gestational age.  Pain/Pressure: Present     Pelvic: Cervical exam deferred        Extremities: Normal range of motion.  Edema: Trace  Mental Status: Normal mood and affect. Normal behavior. Normal judgment and thought content.   Assessment and Plan:  Pregnancy: G3P1102 at [redacted]w[redacted]d  1. Supervision of other normal pregnancy, antepartum Patient is doing well without complaints Patient desires IUD for contraception Follow up ultrasound demonstrates resolution of umbilical cord cyst Follow up growth ultrasound scheduled  2. History of preterm delivery, currently pregnant Patient declined 17-P   Preterm labor symptoms and general obstetric precautions  including but not limited to vaginal bleeding, contractions, leaking of fluid and fetal movement were reviewed in detail with the patient. Please refer to After Visit Summary for other counseling recommendations.  Return in about 2 weeks (around 02/25/2019) for ROB.  Future Appointments  Date Time Provider Department Center  02/25/2019  1:30 PM [facility] [place] 1 [facility]  02/25/2019  2:45 PM Derian Dimalanta, Gigi Gin, MD [facility] None    Catalina Antigua, MD

## 2019-02-25 ENCOUNTER — Encounter: Payer: Self-pay | Admitting: Obstetrics and Gynecology

## 2019-02-25 ENCOUNTER — Ambulatory Visit (HOSPITAL_COMMUNITY)
Admission: RE | Admit: 2019-02-25 | Discharge: 2019-02-25 | Disposition: A | Discharge status: Home / Self Care | Payer: Medicaid Other | Source: Ambulatory Visit | Attending: Obstetrics and Gynecology | Admitting: Obstetrics and Gynecology

## 2019-02-25 ENCOUNTER — Ambulatory Visit (HOSPITAL_COMMUNITY): Payer: Medicaid Other | Admitting: *Deleted

## 2019-02-25 ENCOUNTER — Other Ambulatory Visit: Payer: Self-pay

## 2019-02-25 ENCOUNTER — Ambulatory Visit (INDEPENDENT_AMBULATORY_CARE_PROVIDER_SITE_OTHER): Payer: Medicaid Other | Admitting: Obstetrics and Gynecology

## 2019-02-25 ENCOUNTER — Encounter (HOSPITAL_COMMUNITY): Payer: Self-pay | Admitting: *Deleted

## 2019-02-25 VITALS — BP 110/63 | HR 102 | Wt 211.5 lb

## 2019-02-25 DIAGNOSIS — O09213 Supervision of pregnancy with history of pre-term labor, third trimester: Secondary | ICD-10-CM | POA: Diagnosis not present

## 2019-02-25 DIAGNOSIS — O09219 Supervision of pregnancy with history of pre-term labor, unspecified trimester: Secondary | ICD-10-CM | POA: Insufficient documentation

## 2019-02-25 DIAGNOSIS — O219 Vomiting of pregnancy, unspecified: Secondary | ICD-10-CM | POA: Diagnosis present

## 2019-02-25 DIAGNOSIS — O368990 Maternal care for other specified fetal problems, unspecified trimester, not applicable or unspecified: Secondary | ICD-10-CM

## 2019-02-25 DIAGNOSIS — Z3A32 32 weeks gestation of pregnancy: Secondary | ICD-10-CM | POA: Diagnosis not present

## 2019-02-25 DIAGNOSIS — Z362 Encounter for other antenatal screening follow-up: Secondary | ICD-10-CM

## 2019-02-25 DIAGNOSIS — O09899 Supervision of other high risk pregnancies, unspecified trimester: Secondary | ICD-10-CM

## 2019-02-25 DIAGNOSIS — O368993 Maternal care for other specified fetal problems, unspecified trimester, fetus 3: Secondary | ICD-10-CM

## 2019-02-25 DIAGNOSIS — Z3480 Encounter for supervision of other normal pregnancy, unspecified trimester: Secondary | ICD-10-CM

## 2019-02-25 MED ORDER — COMFORT FIT MATERNITY SUPP LG MISC: 0 refills | Status: DC

## 2019-02-25 NOTE — Progress Notes (Signed)
   PRENATAL VISIT NOTE  Subjective:  Faith Woods is a 23 y.o. H4H8887 at [redacted]w[redacted]d being seen today for ongoing prenatal care.  She is currently monitored for the following issues for this low-risk pregnancy and has Nausea/vomiting in pregnancy; Supervision of other normal pregnancy, antepartum; History of preterm delivery, currently pregnant; and Umbilical cord cyst during pregnancy, antepartum on their problem list.  Patient reports backache.  Contractions: Not present. Vag. Bleeding: None.  Movement: Present. Denies leaking of fluid.   The following portions of the patient's history were reviewed and updated as appropriate: allergies, current medications, past family history, past medical history, past social history, past surgical history and problem list.   Objective:   Vitals:   02/25/19 1439  BP: 110/63  Pulse: (!) 102  Weight: 211 lb 8 oz (95.9 kg)    Fetal Status: Fetal Heart Rate (bpm): 137 Fundal Height: 32 cm Movement: Present     General:  Alert, oriented and cooperative. Patient is in no acute distress.  Skin: Skin is warm and dry. No rash noted.   Cardiovascular: Normal heart rate noted  Respiratory: Normal respiratory effort, no problems with respiration noted  Abdomen: Soft, gravid, appropriate for gestational age.  Pain/Pressure: Present     Pelvic: Cervical exam deferred        Extremities: Normal range of motion.  Edema: Trace  Mental Status: Normal mood and affect. Normal behavior. Normal judgment and thought content.   Assessment and Plan:  Pregnancy: G3P1102 at [redacted]w[redacted]d 1. Supervision of other normal pregnancy, antepartum Patient is doing well Encouraged patient to remain active and perform back stretching exercises Rx for maternity support belt provided Normal growth ultrasound today  2. History of preterm delivery, currently pregnant Patient previously declined 17-P  3. Umbilical cord cyst during pregnancy, antepartum Resolved on 3/18 scan   Preterm labor symptoms and general obstetric precautions including but not limited to vaginal bleeding, contractions, leaking of fluid and fetal movement were reviewed in detail with the patient. Please refer to After Visit Summary for other counseling recommendations.   Return in about 2 weeks (around 03/11/2019) for ROB, Low risk.  Future Appointments  Date Time Provider Department Center  03/11/2019  3:45 PM Betsabe Iglesia, Gigi Gin, MD [facility] None    Catalina Antigua, MD

## 2019-03-11 ENCOUNTER — Ambulatory Visit (INDEPENDENT_AMBULATORY_CARE_PROVIDER_SITE_OTHER): Payer: Medicaid Other | Admitting: Obstetrics and Gynecology

## 2019-03-11 ENCOUNTER — Encounter: Payer: Self-pay | Admitting: Obstetrics and Gynecology

## 2019-03-11 ENCOUNTER — Other Ambulatory Visit: Payer: Self-pay

## 2019-03-11 DIAGNOSIS — O09899 Supervision of other high risk pregnancies, unspecified trimester: Secondary | ICD-10-CM

## 2019-03-11 DIAGNOSIS — Z3A34 34 weeks gestation of pregnancy: Secondary | ICD-10-CM

## 2019-03-11 DIAGNOSIS — O09219 Supervision of pregnancy with history of pre-term labor, unspecified trimester: Secondary | ICD-10-CM

## 2019-03-11 DIAGNOSIS — Z3480 Encounter for supervision of other normal pregnancy, unspecified trimester: Secondary | ICD-10-CM

## 2019-03-11 DIAGNOSIS — O09213 Supervision of pregnancy with history of pre-term labor, third trimester: Secondary | ICD-10-CM

## 2019-03-11 NOTE — Progress Notes (Signed)
Pt states that she has had a cold, cough with congestion. Advised she may take Claritin, Zyrtec for allergies and Robitussin for cough. ?Rx.  Pt states lower pelvic pain.

## 2019-03-11 NOTE — Progress Notes (Signed)
   TELEHEALTH VIRTUAL OBSTETRICS VISIT ENCOUNTER NOTE  I connected with Shakeda Woods on 03/11/19 at  3:45 PM EDT by telephone at home and verified that I am speaking with the correct person using two identifiers.   I discussed the limitations, risks, security and privacy concerns of performing an evaluation and management service by telephone and the availability of in person appointments. I also discussed with the patient that there may be a patient responsible charge related to this service. The patient expressed understanding and agreed to proceed.  Subjective:  Faith Woods is a 23 y.o. [id number] at [redacted]w[redacted]d being followed for ongoing prenatal care.  She is currently monitored for the following issues for this high-risk pregnancy and has Nausea/vomiting in pregnancy; Supervision of other normal pregnancy, antepartum; History of preterm delivery, currently pregnant; and Umbilical cord cyst during pregnancy, antepartum on their problem list.  Patient reports no complaints. Reports fetal movement. Denies any contractions, bleeding or leaking of fluid.   The following portions of the patient's history were reviewed and updated as appropriate: allergies, current medications, past family history, past medical history, past social history, past surgical history and problem list.   Objective:   General:  Alert, oriented and cooperative.   Mental Status: Normal mood and affect perceived. Normal judgment and thought content.  Rest of physical exam deferred due to type of encounter  Assessment and Plan:  Pregnancy: G3P1102 at [redacted]w[redacted]d 1. Supervision of other normal pregnancy, antepartum Patient is doing well without  Patient understands that her future visits may be televisit.  2. History of preterm delivery, currently pregnant Patient previously declined 17-P  Preterm labor symptoms and general obstetric precautions including but not limited to vaginal bleeding, contractions, leaking  of fluid and fetal movement were reviewed in detail with the patient.  I discussed the assessment and treatment plan with the patient. The patient was provided an opportunity to ask questions and all were answered. The patient agreed with the plan and demonstrated an understanding of the instructions. The patient was advised to call back or seek an in-person office evaluation/go to MAU at [facility] for any urgent or concerning symptoms. Please refer to After Visit Summary for other counseling recommendations.   I provided 15 minutes of non-face-to-face time during this encounter.  No follow-ups on file.  No future appointments.  Catalina Antigua, MD Center for [employer], [facility]

## 2019-03-25 ENCOUNTER — Other Ambulatory Visit: Payer: Self-pay

## 2019-03-25 ENCOUNTER — Ambulatory Visit: Payer: Medicaid Other | Admitting: Obstetrics and Gynecology

## 2019-03-25 ENCOUNTER — Other Ambulatory Visit (HOSPITAL_COMMUNITY)
Admission: RE | Admit: 2019-03-25 | Discharge: 2019-03-25 | Disposition: A | Discharge status: Home / Self Care | Payer: Medicaid Other | Source: Ambulatory Visit | Attending: Obstetrics and Gynecology | Admitting: Obstetrics and Gynecology

## 2019-03-25 DIAGNOSIS — Z3480 Encounter for supervision of other normal pregnancy, unspecified trimester: Secondary | ICD-10-CM

## 2019-03-25 NOTE — Progress Notes (Signed)
GBS and vaginal cultures

## 2019-03-26 ENCOUNTER — Ambulatory Visit (INDEPENDENT_AMBULATORY_CARE_PROVIDER_SITE_OTHER): Payer: Medicaid Other | Admitting: Obstetrics & Gynecology

## 2019-03-26 DIAGNOSIS — Z3483 Encounter for supervision of other normal pregnancy, third trimester: Secondary | ICD-10-CM

## 2019-03-26 DIAGNOSIS — Z3A36 36 weeks gestation of pregnancy: Secondary | ICD-10-CM

## 2019-03-26 DIAGNOSIS — Z3480 Encounter for supervision of other normal pregnancy, unspecified trimester: Secondary | ICD-10-CM

## 2019-03-26 LAB — CERVICOVAGINAL ANCILLARY ONLY
Bacterial vaginitis: POSITIVE — AB
Candida vaginitis: POSITIVE — AB
Chlamydia: NEGATIVE
Neisseria Gonorrhea: NEGATIVE
Trichomonas: NEGATIVE

## 2019-03-26 MED ORDER — PANTOPRAZOLE SODIUM 40 MG PO TBEC: 40.0000 mg | DELAYED_RELEASE_TABLET | Freq: Every day | ORAL | 1 refills | Status: DC

## 2019-03-26 NOTE — Progress Notes (Signed)
ROB w/ no complaints.  

## 2019-03-26 NOTE — Progress Notes (Signed)
   TELEHEALTH VIRTUAL OBSTETRICS VISIT ENCOUNTER NOTE  I connected with Faith Woods on 03/26/19 at  8:30 AM EDT by telephone at home and verified that I am speaking with the correct person using two identifiers.   I discussed the limitations, risks, security and privacy concerns of performing an evaluation and management service by telephone and the availability of in person appointments. I also discussed with the patient that there may be a patient responsible charge related to this service. The patient expressed understanding and agreed to proceed.  Subjective:  Faith Woods is a 23 y.o. O0H2122 at [redacted]w[redacted]d being followed for ongoing prenatal care.  She is currently monitored for the following issues for this high-risk pregnancy and has Nausea/vomiting in pregnancy; Supervision of other normal pregnancy, antepartum; History of preterm delivery, currently pregnant; and Umbilical cord cyst during pregnancy, antepartum on their problem list.  Patient reports heartburn, nausea and vomiting. Reports fetal movement. Denies any contractions, bleeding or leaking of fluid.   The following portions of the patient's history were reviewed and updated as appropriate: allergies, current medications, past family history, past medical history, past social history, past surgical history and problem list.   Objective:   General:  Alert, oriented and cooperative.   Mental Status: Normal mood and affect perceived. Normal judgment and thought content.  Rest of physical exam deferred due to type of encounter  Assessment and Plan:  Pregnancy: G3P1102 at [redacted]w[redacted]d 1. Supervision of other normal pregnancy, antepartum Heartburn nausea c/w reflux - pantoprazole (PROTONIX) 40 MG tablet; Take 1 tablet (40 mg total) by mouth daily.  Dispense: 30 tablet; Refill: 1  Preterm labor symptoms and general obstetric precautions including but not limited to vaginal bleeding, contractions, leaking of fluid and fetal  movement were reviewed in detail with the patient.  I discussed the assessment and treatment plan with the patient. The patient was provided an opportunity to ask questions and all were answered. The patient agreed with the plan and demonstrated an understanding of the instructions. The patient was advised to call back or seek an in-person office evaluation/go to MAU at [facility] for any urgent or concerning symptoms. Please refer to After Visit Summary for other counseling recommendations.   I provided 10 minutes of non-face-to-face time during this encounter.  Return in about 1 week (around 04/02/2019) for in person visit.  No future appointments.  Scheryl Darter, MD Center for [facility], [facility]

## 2019-03-26 NOTE — Progress Notes (Signed)
Pt unable to do in person visit today due to child care. Will do self swab for gbs, cultures and do phone visit at a later time

## 2019-03-26 NOTE — Patient Instructions (Signed)

## 2019-03-27 LAB — STREP GP B NAA: Strep Gp B NAA: NEGATIVE

## 2019-04-01 ENCOUNTER — Other Ambulatory Visit: Payer: Self-pay | Admitting: Obstetrics and Gynecology

## 2019-04-01 MED ORDER — CLOTRIMAZOLE 1 % VA CREA: 1.0000 | TOPICAL_CREAM | Freq: Every day | VAGINAL | 0 refills | Status: DC

## 2019-04-01 MED ORDER — METRONIDAZOLE 500 MG PO TABS: 500.0000 mg | ORAL_TABLET | Freq: Two times a day (BID) | ORAL | 0 refills | Status: DC

## 2019-04-02 ENCOUNTER — Encounter: Payer: Self-pay | Admitting: Obstetrics and Gynecology

## 2019-04-02 ENCOUNTER — Other Ambulatory Visit: Payer: Self-pay

## 2019-04-02 ENCOUNTER — Ambulatory Visit (INDEPENDENT_AMBULATORY_CARE_PROVIDER_SITE_OTHER): Payer: Medicaid Other | Admitting: Obstetrics and Gynecology

## 2019-04-02 DIAGNOSIS — Z3480 Encounter for supervision of other normal pregnancy, unspecified trimester: Secondary | ICD-10-CM

## 2019-04-02 DIAGNOSIS — Z3A37 37 weeks gestation of pregnancy: Secondary | ICD-10-CM

## 2019-04-02 DIAGNOSIS — Z3483 Encounter for supervision of other normal pregnancy, third trimester: Secondary | ICD-10-CM

## 2019-04-02 MED ORDER — BLOOD PRESSURE MONITOR KIT: 1.0000 | PACK | 0 refills | Status: AC

## 2019-04-02 NOTE — Patient Instructions (Signed)

## 2019-04-02 NOTE — Progress Notes (Signed)
Subjective:  Faith Woods is a 23 y.o. E3X4356 at [redacted]w[redacted]d being seen today for ongoing prenatal care.  She is currently monitored for the following issues for this high-risk pregnancy and has Nausea/vomiting in pregnancy; Supervision of other normal pregnancy, antepartum; History of preterm delivery, currently pregnant; and Umbilical cord cyst during pregnancy, antepartum on their problem list.  Patient reports general discomforts of pregnancy.  Contractions: Irritability. Vag. Bleeding: None.  Movement: Present. Denies leaking of fluid.   The following portions of the patient's history were reviewed and updated as appropriate: allergies, current medications, past family history, past medical history, past social history, past surgical history and problem list. Problem list updated.  Objective:   Vitals:   04/02/19 1507  BP: 109/68  Pulse: 96  Weight: 214 lb 8 oz (97.3 kg)    Fetal Status: Fetal Heart Rate (bpm): 132   Movement: Present     General:  Alert, oriented and cooperative. Patient is in no acute distress.  Skin: Skin is warm and dry. No rash noted.   Cardiovascular: Normal heart rate noted  Respiratory: Normal respiratory effort, no problems with respiration noted  Abdomen: Soft, gravid, appropriate for gestational age. Pain/Pressure: Present     Pelvic:  Cervical exam deferred        Extremities: Normal range of motion.  Edema: Trace  Mental Status: Normal mood and affect. Normal behavior. Normal judgment and thought content.   Urinalysis:      Assessment and Plan:  Pregnancy: G3P1102 at [redacted]w[redacted]d  1. Supervision of other normal pregnancy, antepartum Stable Labor precautions BP cuff Rx Pt instructed to take medications as prescribed by Dr Vergie Living for vaginal infection - Babyscripts Schedule Optimization  Term labor symptoms and general obstetric precautions including but not limited to vaginal bleeding, contractions, leaking of fluid and fetal movement were  reviewed in detail with the patient. Please refer to After Visit Summary for other counseling recommendations.  Return in about 1 week (around 04/09/2019) for OB visit, televisit.   Hermina Staggers, MD

## 2019-04-02 NOTE — Progress Notes (Signed)
Pt presents for ROB without complaints today.  GBS/GC/CT all neg 03/25/19

## 2019-04-09 ENCOUNTER — Encounter: Payer: Self-pay | Admitting: Obstetrics

## 2019-04-09 ENCOUNTER — Ambulatory Visit (INDEPENDENT_AMBULATORY_CARE_PROVIDER_SITE_OTHER): Payer: Medicaid Other | Admitting: Obstetrics

## 2019-04-09 ENCOUNTER — Other Ambulatory Visit: Payer: Self-pay

## 2019-04-09 DIAGNOSIS — O09899 Supervision of other high risk pregnancies, unspecified trimester: Secondary | ICD-10-CM

## 2019-04-09 DIAGNOSIS — Z3483 Encounter for supervision of other normal pregnancy, third trimester: Secondary | ICD-10-CM

## 2019-04-09 DIAGNOSIS — O09219 Supervision of pregnancy with history of pre-term labor, unspecified trimester: Secondary | ICD-10-CM

## 2019-04-09 DIAGNOSIS — Z3A38 38 weeks gestation of pregnancy: Secondary | ICD-10-CM

## 2019-04-09 DIAGNOSIS — O09213 Supervision of pregnancy with history of pre-term labor, third trimester: Secondary | ICD-10-CM

## 2019-04-09 DIAGNOSIS — Z3480 Encounter for supervision of other normal pregnancy, unspecified trimester: Secondary | ICD-10-CM

## 2019-04-09 NOTE — Progress Notes (Signed)
[facility] ROB: Pt has no concerns today. BP cuff will be shipped sometime this week per pharmacy

## 2019-04-09 NOTE — Progress Notes (Signed)
   TELEHEALTH VIRTUAL OBSTETRICS VISIT ENCOUNTER NOTE  I connected with Faith Woods on 04/09/19 at 10:30 AM EDT by telephone at home and verified that I am speaking with the correct person using two identifiers.   I discussed the limitations, risks, security and privacy concerns of performing an evaluation and management service by telephone and the availability of in person appointments. I also discussed with the patient that there may be a patient responsible charge related to this service. The patient expressed understanding and agreed to proceed.  Subjective:  Faith Woods is a 23 y.o. [id number] at [redacted]w[redacted]d being followed for ongoing prenatal care.  She is currently monitored for the following issues for this high-risk pregnancy and has Nausea/vomiting in pregnancy; Supervision of other normal pregnancy, antepartum; History of preterm delivery, currently pregnant; and Umbilical cord cyst during pregnancy, antepartum on their problem list.  Patient reports no complaints. Reports fetal movement. Denies any contractions, bleeding or leaking of fluid.   The following portions of the patient's history were reviewed and updated as appropriate: allergies, current medications, past family history, past medical history, past social history, past surgical history and problem list.   Objective:   General:  Alert, oriented and cooperative.   Mental Status: Normal mood and affect perceived. Normal judgment and thought content.  Rest of physical exam deferred due to type of encounter  Assessment and Plan:  Pregnancy: G3P1102 at [redacted]w[redacted]d 1. Supervision of other normal pregnancy, antepartum  2. History of preterm delivery, currently pregnant   Term labor symptoms and general obstetric precautions including but not limited to vaginal bleeding, contractions, leaking of fluid and fetal movement were reviewed in detail with the patient.  I discussed the assessment and treatment plan with the  patient. The patient was provided an opportunity to ask questions and all were answered. The patient agreed with the plan and demonstrated an understanding of the instructions. The patient was advised to call back or seek an in-person office evaluation/go to MAU at [facility] for any urgent or concerning symptoms. Please refer to After Visit Summary for other counseling recommendations.   I provided 8 minutes of non-face-to-face time during this encounter.  Return in about 1 week (around 04/16/2019) for ROB.  Future Appointments  Date Time Provider Department Center  04/09/2019 10:30 AM Brock Bad, MD [facility] None  04/16/2019  1:45 PM Conan Bowens, MD [facility] None    Coral Ceo, MD [facility] for [facility], [facility] 04-09-2019

## 2019-04-16 ENCOUNTER — Ambulatory Visit (INDEPENDENT_AMBULATORY_CARE_PROVIDER_SITE_OTHER): Payer: Medicaid Other | Admitting: Obstetrics and Gynecology

## 2019-04-16 ENCOUNTER — Encounter: Payer: Self-pay | Admitting: Obstetrics and Gynecology

## 2019-04-16 ENCOUNTER — Other Ambulatory Visit: Payer: Self-pay

## 2019-04-16 DIAGNOSIS — O09899 Supervision of other high risk pregnancies, unspecified trimester: Secondary | ICD-10-CM

## 2019-04-16 DIAGNOSIS — Z3480 Encounter for supervision of other normal pregnancy, unspecified trimester: Secondary | ICD-10-CM

## 2019-04-16 DIAGNOSIS — Z3A39 39 weeks gestation of pregnancy: Secondary | ICD-10-CM

## 2019-04-16 DIAGNOSIS — O09219 Supervision of pregnancy with history of pre-term labor, unspecified trimester: Secondary | ICD-10-CM

## 2019-04-16 DIAGNOSIS — O09213 Supervision of pregnancy with history of pre-term labor, third trimester: Secondary | ICD-10-CM

## 2019-04-16 DIAGNOSIS — N898 Other specified noninflammatory disorders of vagina: Secondary | ICD-10-CM

## 2019-04-16 NOTE — Addendum Note (Signed)
Addended by: Leroy Libman on: 04/16/2019 02:36 PM   Modules accepted: Orders

## 2019-04-16 NOTE — Progress Notes (Signed)
   PRENATAL VISIT NOTE  Subjective:  Faith Woods is a 23 y.o. [id number] at [redacted]w[redacted]d being seen today for ongoing prenatal care.  She is currently monitored for the following issues for this high-risk pregnancy and has Nausea/vomiting in pregnancy; Supervision of other normal pregnancy, antepartum; History of preterm delivery, currently pregnant; and Umbilical cord cyst during pregnancy, antepartum on their problem list.  Patient reports yellow vaginal discharge.  Contractions: Irregular. Vag. Bleeding: None.  Movement: Present. Denies leaking of fluid.   The following portions of the patient's history were reviewed and updated as appropriate: allergies, current medications, past family history, past medical history, past social history, past surgical history and problem list.   Objective:  There were no vitals filed for this visit.  Fetal Status: Fetal Heart Rate (bpm): 135   Movement: Present     General:  Alert, oriented and cooperative. Patient is in no acute distress.  Skin: Skin is warm and dry. No rash noted.   Cardiovascular: Normal heart rate noted  Respiratory: Normal respiratory effort, no problems with respiration noted  Abdomen: Soft, gravid, appropriate for gestational age.  Pain/Pressure: Present     Pelvic: 1/50/-2, cephalic by sutures and palpation on abdomen        Extremities: Normal range of motion.  Edema: Trace  Mental Status: Normal mood and affect. Normal behavior. Normal judgment and thought content.   Assessment and Plan:  Pregnancy: G3P1102 at [redacted]w[redacted]d  1. Supervision of other normal pregnancy, antepartum IOL scheduled today  2. History of preterm delivery, currently pregnant  3. Vaginal discharge Dx with BV and yeast several visits ago, has not taken her medication   Term labor symptoms and general obstetric precautions including but not limited to vaginal bleeding, contractions, leaking of fluid and fetal movement were reviewed in detail with the  patient. Please refer to After Visit Summary for other counseling recommendations.   Return in about 1 week (around 04/23/2019) for OB visit.  Future Appointments  Date Time Provider Department Center  04/25/2019  7:00 AM [facility] SCHED ROOM [facility] None    Conan Bowens, MD

## 2019-04-16 NOTE — Progress Notes (Signed)
Pt is here for ROB. [redacted]w[redacted]d. Pt diagnosed with BV and yeast last month, pt reports she is still having abnormal yellow discharge, but patient reports she did not finish the treatment (terazol) she was given.

## 2019-04-18 ENCOUNTER — Inpatient Hospital Stay (HOSPITAL_COMMUNITY): Payer: Medicaid Other | Admitting: Anesthesiology

## 2019-04-18 ENCOUNTER — Inpatient Hospital Stay (HOSPITAL_COMMUNITY)
Admission: AD | Admit: 2019-04-18 | Discharge: 2019-04-20 | DRG: 807 | Disposition: A | Discharge status: Home / Self Care | Payer: Medicaid Other | Attending: Obstetrics and Gynecology | Admitting: Obstetrics and Gynecology

## 2019-04-18 ENCOUNTER — Other Ambulatory Visit: Payer: Self-pay

## 2019-04-18 ENCOUNTER — Encounter (HOSPITAL_COMMUNITY): Payer: Self-pay

## 2019-04-18 DIAGNOSIS — Z3A40 40 weeks gestation of pregnancy: Secondary | ICD-10-CM

## 2019-04-18 DIAGNOSIS — O480 Post-term pregnancy: Secondary | ICD-10-CM | POA: Diagnosis present

## 2019-04-18 DIAGNOSIS — Z3480 Encounter for supervision of other normal pregnancy, unspecified trimester: Secondary | ICD-10-CM

## 2019-04-18 DIAGNOSIS — O219 Vomiting of pregnancy, unspecified: Secondary | ICD-10-CM

## 2019-04-18 DIAGNOSIS — O09899 Supervision of other high risk pregnancies, unspecified trimester: Secondary | ICD-10-CM

## 2019-04-18 DIAGNOSIS — O26893 Other specified pregnancy related conditions, third trimester: Secondary | ICD-10-CM | POA: Diagnosis present

## 2019-04-18 LAB — CBC
HCT: 36.3 % (ref 36.0–46.0)
Hemoglobin: 12.2 g/dL (ref 12.0–15.0)
MCH: 30.6 pg (ref 26.0–34.0)
MCHC: 33.6 g/dL (ref 30.0–36.0)
MCV: 91 fL (ref 80.0–100.0)
Platelets: 140 10*3/uL — ABNORMAL LOW (ref 150–400)
RBC: 3.99 MIL/uL (ref 3.87–5.11)
RDW: 13.1 % (ref 11.5–15.5)
WBC: 10 10*3/uL (ref 4.0–10.5)
nRBC: 0 % (ref 0.0–0.2)

## 2019-04-18 LAB — TYPE AND SCREEN
ABO/RH(D): O POS
Antibody Screen: NEGATIVE

## 2019-04-18 LAB — RPR: RPR Ser Ql: NONREACTIVE

## 2019-04-18 LAB — ABO/RH: ABO/RH(D): O POS

## 2019-04-18 MED ORDER — OXYTOCIN BOLUS FROM INFUSION
500.0000 mL | Freq: Once | INTRAVENOUS | Status: AC
  Administered 2019-04-18: 14:00:00 500 mL via INTRAVENOUS

## 2019-04-18 MED ORDER — OXYCODONE-ACETAMINOPHEN 5-325 MG PO TABS: 2.0000 | ORAL_TABLET | ORAL | Status: DC | PRN

## 2019-04-18 MED ORDER — FLEET ENEMA 7-19 GM/118ML RE ENEM: 1.0000 | ENEMA | Freq: Every day | RECTAL | Status: DC | PRN

## 2019-04-18 MED ORDER — LIDOCAINE HCL (PF) 1 % IJ SOLN: 30.0000 mL | INTRAMUSCULAR | Status: DC | PRN

## 2019-04-18 MED ORDER — SOD CITRATE-CITRIC ACID 500-334 MG/5ML PO SOLN: 30.0000 mL | ORAL | Status: DC | PRN

## 2019-04-18 MED ORDER — BENZOCAINE-MENTHOL 20-0.5 % EX AERO
1.0000 "application " | INHALATION_SPRAY | CUTANEOUS | Status: DC | PRN
  Filled 2019-04-18: qty 56

## 2019-04-18 MED ORDER — LIDOCAINE HCL (PF) 1 % IJ SOLN
INTRAMUSCULAR | Status: DC | PRN
  Administered 2019-04-18 (×2): 4 mL via EPIDURAL

## 2019-04-18 MED ORDER — OXYCODONE-ACETAMINOPHEN 5-325 MG PO TABS: 1.0000 | ORAL_TABLET | ORAL | Status: DC | PRN

## 2019-04-18 MED ORDER — FENTANYL CITRATE (PF) 100 MCG/2ML IJ SOLN
100.0000 ug | INTRAMUSCULAR | Status: DC | PRN
  Administered 2019-04-18 (×4): 100 ug via INTRAVENOUS
  Filled 2019-04-18 (×4): qty 2

## 2019-04-18 MED ORDER — FENTANYL-BUPIVACAINE-NACL 0.5-0.125-0.9 MG/250ML-% EP SOLN
12.0000 mL/h | EPIDURAL | Status: DC | PRN
  Filled 2019-04-18: qty 250

## 2019-04-18 MED ORDER — SODIUM CHLORIDE (PF) 0.9 % IJ SOLN
INTRAMUSCULAR | Status: DC | PRN
  Administered 2019-04-18: 12 mL/h via EPIDURAL

## 2019-04-18 MED ORDER — ONDANSETRON HCL 4 MG PO TABS: 4.0000 mg | ORAL_TABLET | ORAL | Status: DC | PRN

## 2019-04-18 MED ORDER — LACTATED RINGERS IV SOLN: 500.0000 mL | INTRAVENOUS | Status: DC | PRN

## 2019-04-18 MED ORDER — EPHEDRINE 5 MG/ML INJ: 10.0000 mg | INTRAVENOUS | Status: DC | PRN

## 2019-04-18 MED ORDER — METHYLERGONOVINE MALEATE 0.2 MG/ML IJ SOLN: 0.2000 mg | INTRAMUSCULAR | Status: DC | PRN

## 2019-04-18 MED ORDER — BISACODYL 10 MG RE SUPP: 10.0000 mg | Freq: Every day | RECTAL | Status: DC | PRN

## 2019-04-18 MED ORDER — FERROUS SULFATE 325 (65 FE) MG PO TABS
325.0000 mg | ORAL_TABLET | Freq: Two times a day (BID) | ORAL | Status: DC
  Administered 2019-04-18 – 2019-04-20 (×5): 325 mg via ORAL
  Filled 2019-04-18 (×4): qty 1

## 2019-04-18 MED ORDER — ONDANSETRON HCL 4 MG/2ML IJ SOLN: 4.0000 mg | Freq: Four times a day (QID) | INTRAMUSCULAR | Status: DC | PRN

## 2019-04-18 MED ORDER — TETANUS-DIPHTH-ACELL PERTUSSIS 5-2.5-18.5 LF-MCG/0.5 IM SUSP: 0.5000 mL | Freq: Once | INTRAMUSCULAR | Status: DC

## 2019-04-18 MED ORDER — LACTATED RINGERS IV SOLN
500.0000 mL | Freq: Once | INTRAVENOUS | Status: AC
  Administered 2019-04-18: 12:00:00 500 mL via INTRAVENOUS

## 2019-04-18 MED ORDER — ACETAMINOPHEN 325 MG PO TABS
650.0000 mg | ORAL_TABLET | ORAL | Status: DC | PRN
  Administered 2019-04-18: 650 mg via ORAL
  Filled 2019-04-18: qty 2

## 2019-04-18 MED ORDER — COCONUT OIL OIL: 1.0000 "application " | TOPICAL_OIL | Status: DC | PRN

## 2019-04-18 MED ORDER — DIBUCAINE (PERIANAL) 1 % EX OINT: 1.0000 "application " | TOPICAL_OINTMENT | CUTANEOUS | Status: DC | PRN

## 2019-04-18 MED ORDER — MEASLES, MUMPS & RUBELLA VAC IJ SOLR: 0.5000 mL | Freq: Once | INTRAMUSCULAR | Status: DC

## 2019-04-18 MED ORDER — LACTATED RINGERS IV SOLN
INTRAVENOUS | Status: DC
  Administered 2019-04-18: 08:00:00 via INTRAVENOUS

## 2019-04-18 MED ORDER — IBUPROFEN 600 MG PO TABS
600.0000 mg | ORAL_TABLET | Freq: Four times a day (QID) | ORAL | Status: DC
  Administered 2019-04-18 – 2019-04-20 (×7): 600 mg via ORAL
  Filled 2019-04-18 (×8): qty 1

## 2019-04-18 MED ORDER — DIPHENHYDRAMINE HCL 25 MG PO CAPS: 25.0000 mg | ORAL_CAPSULE | Freq: Four times a day (QID) | ORAL | Status: DC | PRN

## 2019-04-18 MED ORDER — DOCUSATE SODIUM 100 MG PO CAPS
100.0000 mg | ORAL_CAPSULE | Freq: Two times a day (BID) | ORAL | Status: DC
  Administered 2019-04-18 – 2019-04-20 (×4): 100 mg via ORAL
  Filled 2019-04-18 (×4): qty 1

## 2019-04-18 MED ORDER — MISOPROSTOL 25 MCG QUARTER TABLET: 25.0000 ug | ORAL_TABLET | ORAL | Status: DC | PRN

## 2019-04-18 MED ORDER — SIMETHICONE 80 MG PO CHEW: 80.0000 mg | CHEWABLE_TABLET | ORAL | Status: DC | PRN

## 2019-04-18 MED ORDER — PHENYLEPHRINE 40 MCG/ML (10ML) SYRINGE FOR IV PUSH (FOR BLOOD PRESSURE SUPPORT)
80.0000 ug | PREFILLED_SYRINGE | INTRAVENOUS | Status: DC | PRN
  Filled 2019-04-18: qty 10

## 2019-04-18 MED ORDER — LACTATED RINGERS IV SOLN: 500.0000 mL | Freq: Once | INTRAVENOUS | Status: DC

## 2019-04-18 MED ORDER — ACETAMINOPHEN 325 MG PO TABS: 650.0000 mg | ORAL_TABLET | ORAL | Status: DC | PRN

## 2019-04-18 MED ORDER — TERBUTALINE SULFATE 1 MG/ML IJ SOLN: 0.2500 mg | Freq: Once | INTRAMUSCULAR | Status: DC | PRN

## 2019-04-18 MED ORDER — WITCH HAZEL-GLYCERIN EX PADS: 1.0000 "application " | MEDICATED_PAD | CUTANEOUS | Status: DC | PRN

## 2019-04-18 MED ORDER — PHENYLEPHRINE 40 MCG/ML (10ML) SYRINGE FOR IV PUSH (FOR BLOOD PRESSURE SUPPORT): 80.0000 ug | PREFILLED_SYRINGE | INTRAVENOUS | Status: DC | PRN

## 2019-04-18 MED ORDER — ZOLPIDEM TARTRATE 5 MG PO TABS: 5.0000 mg | ORAL_TABLET | Freq: Every evening | ORAL | Status: DC | PRN

## 2019-04-18 MED ORDER — MISOPROSTOL 200 MCG PO TABS
ORAL_TABLET | ORAL | Status: AC
  Filled 2019-04-18: qty 1

## 2019-04-18 MED ORDER — OXYTOCIN 40 UNITS IN NORMAL SALINE INFUSION - SIMPLE MED
2.5000 [IU]/h | INTRAVENOUS | Status: DC
  Administered 2019-04-18: 2.5 [IU]/h via INTRAVENOUS
  Filled 2019-04-18 (×2): qty 1000

## 2019-04-18 MED ORDER — MISOPROSTOL 200 MCG PO TABS
600.0000 ug | ORAL_TABLET | Freq: Once | ORAL | Status: AC
  Administered 2019-04-18: 16:00:00 600 ug via RECTAL

## 2019-04-18 MED ORDER — PRENATAL MULTIVITAMIN CH
1.0000 | ORAL_TABLET | Freq: Every day | ORAL | Status: DC
  Administered 2019-04-19: 10:00:00 1 via ORAL
  Filled 2019-04-18: qty 1

## 2019-04-18 MED ORDER — DIPHENHYDRAMINE HCL 50 MG/ML IJ SOLN: 12.5000 mg | INTRAMUSCULAR | Status: DC | PRN

## 2019-04-18 MED ORDER — ONDANSETRON HCL 4 MG/2ML IJ SOLN: 4.0000 mg | INTRAMUSCULAR | Status: DC | PRN

## 2019-04-18 MED ORDER — METHYLERGONOVINE MALEATE 0.2 MG PO TABS: 0.2000 mg | ORAL_TABLET | ORAL | Status: DC | PRN

## 2019-04-18 NOTE — Progress Notes (Signed)
PT doing well w/IV pain meds. Ctx q 1-5 minutes.  FHR Cat 1.  PT using peanut ball to help facilitate rotation. May need to consider augmentation if progress continues to stall.

## 2019-04-18 NOTE — Anesthesia Preprocedure Evaluation (Signed)
Anesthesia Evaluation  Patient identified by MRN, date of birth, ID band Patient awake    Reviewed: Allergy & Precautions, Patient's Chart, lab work & pertinent test results  History of Anesthesia Complications Negative for: history of anesthetic complications  Airway Mallampati: II  TM Distance: >3 FB Neck ROM: Full    Dental no notable dental hx.    Pulmonary neg pulmonary ROS,    Pulmonary exam normal breath sounds clear to auscultation       Cardiovascular negative cardio ROS Normal cardiovascular exam Rhythm:Regular Rate:Normal     Neuro/Psych negative neurological ROS     GI/Hepatic Neg liver ROS, GERD  Controlled and Medicated,  Endo/Other  negative endocrine ROS  Renal/GU negative Renal ROS     Musculoskeletal negative musculoskeletal ROS (+)   Abdominal   Peds  Hematology negative hematology ROS (+)   Anesthesia Other Findings Day of surgery medications reviewed with the patient.  Reproductive/Obstetrics (+) Pregnancy                             Anesthesia Physical Anesthesia Plan  ASA: II  Anesthesia Plan: Epidural   Post-op Pain Management:    Induction:   PONV Risk Score and Plan: Treatment may vary due to age or medical condition  Airway Management Planned: Natural Airway  Additional Equipment:   Intra-op Plan:   Post-operative Plan:   Informed Consent: I have reviewed the patients History and Physical, chart, labs and discussed the procedure including the risks, benefits and alternatives for the proposed anesthesia with the patient or authorized representative who has indicated his/her understanding and acceptance.       Plan Discussed with: CRNA  Anesthesia Plan Comments:         Anesthesia Quick Evaluation

## 2019-04-18 NOTE — MAU Note (Signed)
BS US performed by CNM for presentation.  [facility] presentation confirmed at [phone]

## 2019-04-18 NOTE — H&P (Addendum)
Faith Woods is a 23 y.o. female [phone] with IUP at [redacted]w[redacted]d presenting for contractions that started around 62.Marland Kitchen Pt states she has been having regular, every 2-3 minutes contractions, associated with none vaginal bleeding.  Membranes are ruptured, meconium moderate at 0830 with active fetal movement.   [facility]  Prenatal History/Complications: 30 week SVD and a subsequent full term SVD  Past Medical History: Past Medical History:  Diagnosis Date  . Ischemic bowel syndrome [facility])     Past Surgical History: Past Surgical History:  Procedure Laterality Date  . NO PAST SURGERIES      Obstetrical History: OB History    Gravida  3   Para  2   Term  1   Preterm  1   AB      Living  2     SAB      TAB      Ectopic      Multiple  0   Live Births  2            Social History: Social History   Socioeconomic History  . Marital status: Married    Spouse name: Not on file  . Number of children: Not on file  . Years of education: Not on file  . Highest education level: Not on file  Occupational History  . Not on file  Social Needs  . Financial resource strain: Not on file  . Food insecurity:    Worry: Not on file    Inability: Not on file  . Transportation needs:    Medical: Not on file    Non-medical: Not on file  Tobacco Use  . Smoking status: Never Smoker  . Smokeless tobacco: Never Used  Substance and Sexual Activity  . Alcohol use: No  . Drug use: No  . Sexual activity: Yes    Birth control/protection: None    Comment: last sex 06 Sep 2018  Lifestyle  . Physical activity:    Days per week: Not on file    Minutes per session: Not on file  . Stress: Not on file  Relationships  . Social connections:    Talks on phone: Not on file    Gets together: Not on file    Attends religious service: Not on file    Active member of club or organization: Not on file    Attends meetings of clubs or organizations: Not on file     Relationship status: Not on file  Other Topics Concern  . Not on file  Social History Narrative  . Not on file    Family History: History reviewed. No pertinent family history.  Allergies: No Known Allergies  Medications Prior to Admission  Medication Sig Dispense Refill Last Dose  . acetaminophen (TYLENOL) 325 MG tablet Take 650 mg by mouth every 6 (six) hours as needed.   Not Taking  . clotrimazole (GYNE-LOTRIMIN) 1 % vaginal cream Place 1 Applicatorful vaginally at bedtime. For 7 days (Patient not taking: Reported on 04/02/2019) 30 g 0 Not Taking  . Elastic Bandages & Supports (COMFORT FIT MATERNITY SUPP LG) MISC Wear daily when ambulating (Patient not taking: Reported on 03/26/2019) 1 each 0 Not Taking  . metroNIDAZOLE (FLAGYL) 500 MG tablet Take 1 tablet (500 mg total) by mouth 2 (two) times daily. (Patient not taking: Reported on 04/02/2019) 14 tablet 0 Not Taking  . pantoprazole (PROTONIX) 40 MG tablet Take 1 tablet (40 mg total) by mouth daily. (Patient not taking: Reported  on 04/09/2019) 30 tablet 1 Not Taking  . Prenatal Vit-Fe Fumarate-FA (PREPLUS) 27-1 MG TABS Take 1 tablet by mouth daily. 30 tablet 13 Taking  . terconazole (TERAZOL 7) 0.4 % vaginal cream Place 1 applicator vaginally at bedtime. (Patient not taking: Reported on 03/26/2019) 45 g 1 Not Taking        Review of Systems   Constitutional: Negative for fever and chills Eyes: Negative for visual disturbances Respiratory: Negative for shortness of breath, dyspnea Cardiovascular: Negative for chest pain or palpitations  Gastrointestinal: Negative for vomiting, diarrhea and constipation.  POSITIVE for abdominal pain (contractions) Genitourinary: Negative for dysuria and urgency Musculoskeletal: Negative for back pain, joint pain, myalgias  Neurological: Negative for dizziness and headaches      Blood pressure 122/75, pulse 80, temperature 98 F (36.7 C), temperature source Oral, resp. rate 16, height   (1.676 m), weight 99.2 kg, last menstrual period 07/12/2018, SpO2 100 %, currently breastfeeding. General appearance: alert, cooperative and no distress Lungs: clear to auscultation bilaterally Heart: regular rate and rhythm Abdomen: soft, non-tender; bowel sounds normal Extremities: Homans sign is negative, no sign of DVT DTR's 2+ Presentation: cephalic Fetal monitoring  Baseline: 145 bpm, Variability: Good {> 6 bpm), Accelerations: Reactive and Decelerations: Absent Uterine activity  2-3 Dilation: 6.5 Effacement (%): 100 Station: -2 Exam by:: Verdie Drown, RN   Prenatal labs: ABO, Rh: --/--/O POS (05/09 0818) Antibody: NEG (05/09 0818) Rubella: 2.60 (10/15 1553) RPR: Non Reactive (02/12 1022)  HBsAg: Negative (10/15 1553)  HIV: Non Reactive (02/12 1022)  GBS: Negative (04/15 0420)    Prenatal Transfer Tool  Maternal Diabetes: No Genetic Screening: Normal Maternal Ultrasounds/Referrals: Normal Fetal Ultrasounds or other Referrals:  None Maternal Substance Abuse:  No Significant Maternal Medications:  None Significant Maternal Lab Results: Lab values include: Group B Strep negative     Results for orders placed or performed during the hospital encounter of 04/18/19 (from the past 24 hour(s))  CBC   Collection Time: 04/18/19  8:18 AM  Result Value Ref Range   WBC 10.0 4.0 - 10.5 K/uL   RBC 3.99 3.87 - 5.11 MIL/uL   Hemoglobin 12.2 12.0 - 15.0 g/dL   HCT 40.9 81.1 - 91.4 %   MCV 91.0 80.0 - 100.0 fL   MCH 30.6 26.0 - 34.0 pg   MCHC 33.6 30.0 - 36.0 g/dL   RDW 78.2 95.6 - 21.3 %   Platelets 140 (L) 150 - 400 K/uL   nRBC 0.0 0.0 - 0.2 %  Type and screen   Collection Time: 04/18/19  8:18 AM  Result Value Ref Range   ABO/RH(D) O POS    Antibody Screen NEG    Sample Expiration      04/21/2019,2359 Performed at [facility], [address], [place], [place] [address]    Nursing Staff Provider  Office Location [facility] Dating  LMP c/w 18week sono   Language  English Anatomy US   nl with umbilical cord cyst- f/u resolved  Flu Vaccine   Declined 11/18/18 Genetic Screen  NIPS:   Low risk AFP neg   TDaP vaccine   Declined 01/21/2019 Hgb A1C or  GTT 84/150/100 Third trimester   Rhogam     LAB RESULTS   Feeding Plan  both breast and bottle Blood Type O/Positive/-- (10/15 1553)   Contraception  IUD Antibody Negative (10/15 1553)  Circumcision  yes Rubella 2.60 (10/15 1553)  Pediatrician   [facility] RPR Non Reactive (02/12  1022)   Support Person  Husband HBsAg Negative (10/15 1553)   Prenatal Classes no HIV Non Reactive (02/12 1022)  BTL Consent  GBS Negative (04/15 0420)neg  VBAC Consent  Pap  negative (09/23/2018)    Hgb Electro  AA    CF  negative    SMA  2 copies    Waterbirth  [ ]  Class [ ]  Consent [ ]  CNM visit     Assessment: Faith Woods is a 23 y.o. [id number] with an IUP at 554w0d presenting for labor  Plan: #Labor: expectant management #Pain:  Per request #FWB Cat 1 #IFrances Cresenzo-Dishmon 04/18/2019, 9:51 AM

## 2019-04-18 NOTE — Anesthesia Procedure Notes (Signed)
Epidural Patient location during procedure: OB Start time: 04/18/2019 12:40 PM End time: 04/18/2019 12:44 PM  Staffing Anesthesiologist: Kaylyn Layer, MD Performed: anesthesiologist   Preanesthetic Checklist Completed: patient identified, pre-op evaluation, timeout performed, IV checked, risks and benefits discussed and monitors and equipment checked  Epidural Patient position: sitting Prep: site prepped and draped and DuraPrep Patient monitoring: continuous pulse ox, blood pressure, heart rate and cardiac monitor Approach: midline Location: L3-L4 Injection technique: LOR air  Needle:  Needle type: Tuohy  Needle gauge: 17 G Needle length: 9 cm Needle insertion depth: 6 cm Catheter type: closed end flexible Catheter size: 19 Gauge Catheter at skin depth: 11 cm Test dose: negative and Other (1% lidocaine)  Assessment Events: blood not aspirated, injection not painful, no injection resistance, negative IV test and no paresthesia  Additional Notes Patient identified. Risks, benefits, and alternatives discussed with patient including but not limited to bleeding, infection, nerve damage, paralysis, failed block, incomplete pain control, headache, blood pressure changes, nausea, vomiting, reactions to medication, itching, and postpartum back pain. Confirmed with bedside nurse the patient's most recent platelet count. Confirmed with patient that they are not currently taking any anticoagulation, have any bleeding history, or any family history of bleeding disorders. Patient expressed understanding and wished to proceed. All questions were answered. Sterile technique was used throughout the entire procedure. Please see nursing notes for vital signs. LOR obtained after 2 needle redirections. Test dose was given through epidural catheter and negative prior to continuing to dose epidural or start infusion. Warning signs of high block given to the patient including shortness of breath,  tingling/numbness in hands, complete motor block, or any concerning symptoms with instructions to call for help. Patient was given instructions on fall risk and not to get out of bed. All questions and concerns addressed with instructions to call with any issues or inadequate analgesia.  Reason for block:procedure for pain

## 2019-04-18 NOTE — Progress Notes (Signed)
Mother introduced to room, plan of care reviewed. Formula type/ brand discussed. Gerber given. Discussed [place], Baby Safety Sheet, and Feeding/Wet/Stool log.  

## 2019-04-18 NOTE — Discharge Summary (Signed)
Postpartum Discharge Summary     Patient Name: Faith Woods DOB: 09/03/1996 MRN: [id number]  Date of admission: 04/18/2019 Delivering Provider: Jacklyn Shell   Date of discharge: 04/20/2019  Admitting diagnosis: ctx 3 min  Intrauterine pregnancy: [redacted]w[redacted]d     Secondary diagnosis:  Active Problems:   Post-dates pregnancy  Additional problems: none     Discharge diagnosis: Term Pregnancy Delivered                                                                                                Post partum procedures:none  Augmentation: none  Complications: [facility] >1000cc  Hospital course:  Onset of Labor With Vaginal Delivery     23 y.o. yo [id number] at [redacted]w[redacted]d was admitted in Active Labor on 04/18/2019. Patient had an uncomplicated labor course as follows:  SROM w/light/mod mec.  20 minute 2nd stage. Membrane Rupture Time/Date: 8:14 AM ,04/18/2019   Intrapartum Procedures: Episiotomy: None [1]                                         Lacerations:  None [1]  Patient had a delivery of a Viable infant. 04/18/2019  Information for the patient's newborn:  Jaiden, [place] [[id number]]  Delivery Method: Vag-Spont(filed from delivery)    Pateint had an uncomplicated postpartum course.  She is ambulating, tolerating a regular diet, passing flatus, and urinating well. Patient is discharged home in stable condition on 04/20/19.   Magnesium Sulfate recieved: No BMZ received: No  Physical exam  Vitals:   04/19/19 0810 04/19/19 1808 04/20/19 0004 04/20/19 0600  BP: 98/72 111/79 115/71 103/71  Pulse: 71 80 68 68  Resp: 17  16 16   Temp: 99.1 F (37.3 C) 98.2 F (36.8 C) 98.6 F (37 C) 98.6 F (37 C)  TempSrc: Oral  Oral Oral  SpO2:      Weight:      Height:       General: alert, cooperative and no distress Lochia: appropriate Uterine Fundus: firm Incision: N/A DVT Evaluation: No evidence of DVT seen on physical exam. Labs: Lab Results  Component Value Date    WBC 13.4 (H) 04/19/2019   HGB 11.4 (L) 04/19/2019   HCT 33.4 (L) 04/19/2019   MCV 90.0 04/19/2019   PLT 152 04/19/2019   CMP Latest Ref Rng & Units 09/23/2018  Glucose 65 - 99 mg/dL 77  BUN 6 - 20 mg/dL 6  Creatinine 1.74 - 9.44 mg/dL 9.67(R)  Sodium 916 - 384 mmol/L 137  Potassium 3.5 - 5.2 mmol/L 4.1  Chloride 96 - 106 mmol/L 102  CO2 20 - 29 mmol/L 20  Calcium 8.7 - 10.2 mg/dL 9.7  Total Protein 6.0 - 8.5 g/dL 6.9  Total Bilirubin 0.0 - 1.2 mg/dL 0.4  Alkaline Phos 39 - 117 IU/L 48  AST 0 - 40 IU/L 17  ALT 0 - 32 IU/L 9    Discharge instruction: per After Visit Summary and "Baby and Me Booklet".  After visit meds:  Allergies  as of 04/20/2019   No Known Allergies     Medication List    STOP taking these medications   acetaminophen 325 MG tablet Commonly known as:  TYLENOL   clotrimazole 1 % vaginal cream Commonly known as:  GYNE-LOTRIMIN   Comfort Fit Maternity Supp Lg Misc   metroNIDAZOLE 500 MG tablet Commonly known as:  FLAGYL   pantoprazole 40 MG tablet Commonly known as:  Protonix   terconazole 0.4 % vaginal cream Commonly known as:  TERAZOL 7     TAKE these medications   ibuprofen 600 MG tablet Commonly known as:  ADVIL Take 1 tablet (600 mg total) by mouth every 6 (six) hours.   PrePLUS 27-1 MG Tabs Take 1 tablet by mouth daily.       Diet: routine diet  Activity: Advance as tolerated. Pelvic rest for 6 weeks.   Outpatient follow up:4 weeks Follow up Appt: Future Appointments  Date Time Provider Department Center  04/20/2019  2:45 PM [facility] NURSE [facility]-US  04/20/2019  2:45 PM [facility] US 2 [facility]-US  04/23/2019 11:15 AM Hermina StaggersErvin, Michael L, MD [facility] None   Follow up Visit: Follow-up Information    [facility] Follow up in 4 week(s).   Specialty:  Obstetrics and Gynecology Contact information: [address], [address] [place] [id number] [phone]           Please  schedule this patient for Postpartum visit in: 4 weeks with the following provider: Any provider For C/S patients schedule nurse incision check in weeks 2 weeks: no Low risk pregnancy complicated by:  Delivery mode:  SVD Anticipated Birth Control:  IUD PP Procedures needed:   Schedule Integrated BH visit: no      Newborn Data: Live born female  Birth Weight:   APGAR: 9, 9  Newborn Delivery   Birth date/time:  04/18/2019 13:58:00 Delivery type:  Vaginal, Spontaneous     Baby Feeding: Breast Disposition:home with mother   04/20/2019 Scheryl DarterJames , MD

## 2019-04-18 NOTE — MAU Note (Signed)
Faith Woods is a 23 y.o. at [redacted]w[redacted]d here in MAU reporting: contractions since about 0245 and now they are every 3 min. No LOF, reports small bleeding. Has not felt movement since 0500.  Onset of complaint: 0245  Pain score: 7/10  Vitals:   04/18/19 0720  BP: 133/62  Pulse: 85  Resp: 18  Temp: 98.5 F (36.9 C)  SpO2: 100%      FHT:143  Lab orders placed from triage: none

## 2019-04-19 LAB — CBC
HCT: 33.4 % — ABNORMAL LOW (ref 36.0–46.0)
Hemoglobin: 11.4 g/dL — ABNORMAL LOW (ref 12.0–15.0)
MCH: 30.7 pg (ref 26.0–34.0)
MCHC: 34.1 g/dL (ref 30.0–36.0)
MCV: 90 fL (ref 80.0–100.0)
Platelets: 152 10*3/uL (ref 150–400)
RBC: 3.71 MIL/uL — ABNORMAL LOW (ref 3.87–5.11)
RDW: 13.1 % (ref 11.5–15.5)
WBC: 13.4 10*3/uL — ABNORMAL HIGH (ref 4.0–10.5)
nRBC: 0 % (ref 0.0–0.2)

## 2019-04-19 NOTE — Progress Notes (Signed)
Post Partum Day 1 Subjective: no complaints, up ad lib, voiding and tolerating PO, small lochia, plans to breastfeed, plans to bottle feed, IUD  Objective: Blood pressure 111/83, pulse 85, temperature 99 F (37.2 C), temperature source Oral, resp. rate 16, height 5\' 6"  (1.676 m), weight 99.2 kg, last menstrual period 07/12/2018, SpO2 100 %, unknown if currently breastfeeding.  Physical Exam:  General: alert, cooperative and no distress Lochia:normal flow Chest: CTAB Heart: RRR no m/r/g Abdomen: +BS, soft, nontender,  Uterine Fundus: firm DVT Evaluation: No evidence of DVT seen on physical exam. Extremities: no edema  Recent Labs    04/18/19 0818 04/19/19 0608  HGB 12.2 11.4*  HCT 36.3 33.4*    Assessment/Plan: Plan for discharge tomorrow   LOS: 1 day   Jacklyn Shell 04/19/2019, 7:48 AM

## 2019-04-19 NOTE — Lactation Note (Addendum)
This note was copied from a baby's chart. Lactation Consultation Note  Patient Name: Faith Woods Today's Date: 04/19/2019 Reason for consult: Initial assessment;Term P3, 14 hour female infant. Infant had 2 stools and one void since delivery. Mom is experienced at breastfeeding she breastfeed her eldest child for 10 months and 2nd child for one year. Mom has DEBP at home. Per mom, infant has been latching to breast but she was concern she did not have breast milk. Per mom,  she did see breast changes in recent  pregnancy but she felt she had more milk with  her other children in the beginning day one. LC discussed each pregnancy is different as well as infant,  LC encouraged mom  to keep breastfeeding according hunger cues, 8 or more times per day. LC discussed hand expression and LC ask mom teach back colostrum is present in left breast, mom was pleased to see that she had milk present in breast. Mom's feeding choice at admission was breast and formula feeding she has been giving infant formula. Mom plans to breastfeed infant first and then supplement with formula afterwards according infant's age/ hours this is mom's choice. Mom latched infant, after a few attempts on left breast using cross cradle hold, mom has large breast and nipple, LC ask mom bring infant to breast chin first and wait until mouth is wide, like "biting into an apple". Infant was still breastfeeding (15 minutes) when LC left the room. LC used visual aids of latching and position using the Mother and Baby Booklet and showing mom what a wide infant latch looks like. Mom knows to breastfeed according hunger cues, 8 or more times within 24 hours. Mom will call Nurse or LC if she has any questions, concerns or need further assistance with latching infant to breast. Mom will do as much STS as possible with infant. LC discussed I & O. LC discussed [facility] [facility]. Reviewed Baby & Me  book's Breastfeeding Basics.  Mom made aware of O/P services, breastfeeding support groups, community resources, and our phone # for post-discharge questions.    Maternal Data Formula Feeding for Exclusion: Yes Reason for exclusion: Mother's choice to formula and breast feed on admission Has patient been taught Hand Expression?: Yes(colostrum present in left breast only currently) Does the patient have breastfeeding experience prior to this delivery?: Yes  Feeding Feeding Type: Breast Fed  LATCH Score Latch: Repeated attempts needed to sustain latch, nipple held in mouth throughout feeding, stimulation needed to elicit sucking reflex.  Audible Swallowing: A few with stimulation  Type of Nipple: Everted at rest and after stimulation  Comfort (Breast/Nipple): Soft / non-tender  Hold (Positioning): Assistance needed to correctly position infant at breast and maintain latch.  LATCH Score: 7  Interventions Interventions: Breast feeding basics reviewed;Assisted with latch;Skin to skin;Breast massage;Hand express;Support pillows;Adjust position;Breast compression;Expressed milk;Position options  Lactation Tools Discussed/Used WIC Program: Yes   Consult Status Consult Status: Follow-up Date: 04/19/19 Follow-up type: In-patient    Danelle Earthly 04/19/2019, 4:53 AM

## 2019-04-19 NOTE — Anesthesia Postprocedure Evaluation (Signed)
Anesthesia Post Note  Patient: Faith Woods  Procedure(s) Performed: AN AD HOC LABOR EPIDURAL     Patient location during evaluation: Mother Baby Anesthesia Type: Epidural Level of consciousness: awake and alert Pain management: pain level controlled Vital Signs Assessment: post-procedure vital signs reviewed and stable Respiratory status: spontaneous breathing, nonlabored ventilation and respiratory function stable Cardiovascular status: stable Postop Assessment: no headache, no backache and epidural receding Anesthetic complications: no    Last Vitals:  Vitals:   04/19/19 0400 04/19/19 0810  BP: 111/83 98/72  Pulse: 85 71  Resp: 16 17  Temp: 37.2 C 37.3 C  SpO2:      Last Pain:  Vitals:   04/19/19 0810  TempSrc: Oral  PainSc: 0-No pain   Pain Goal:                Epidural/Spinal Function Cutaneous sensation: Normal sensation (04/19/19 0810)  Marquette Saa

## 2019-04-20 ENCOUNTER — Ambulatory Visit (HOSPITAL_COMMUNITY): Payer: Medicaid Other

## 2019-04-20 MED ORDER — IBUPROFEN 600 MG PO TABS: 600.0000 mg | ORAL_TABLET | Freq: Four times a day (QID) | ORAL | 0 refills | Status: DC

## 2019-04-20 NOTE — Lactation Note (Signed)
This note was copied from a baby's chart. Lactation Consultation Note  Patient Name: Faith Woods Today's Date: 04/20/2019 Reason for consult: Follow-up assessment;Infant weight loss;Term P3, 39 hour female infant. Infant had 6 stools and 3 voids. Mom has been breastfeeding and supplementing with formula. Sometimes alternating feeding breastfeeding 30 minutes and few feeds with formula only. Per mom, she feels breastfeeding is going well, colostrum is now present both breast and she is happy to see that. LC entered the room,  mom was on the side of the  bed breastfeeding infant on her  right breast using the cradle hold, infant had a deep latch and suckling in a rthymitic pattern.  Mom had been breastfeeding for 10 minutes when LC left the room.  Mom will continue to breast feed according to hunger cues, 8 or more times within 24 hours.    Maternal Data    Feeding Feeding Type: Bottle Fed - Formula  LATCH Score Latch: Grasps breast easily, tongue down, lips flanged, rhythmical sucking.  Audible Swallowing: A few with stimulation  Type of Nipple: Everted at rest and after stimulation  Comfort (Breast/Nipple): Soft / non-tender  Hold (Positioning): No assistance needed to correctly position infant at breast.  LATCH Score: 9  Interventions Interventions: Skin to skin;Position options  Lactation Tools Discussed/Used     Consult Status Consult Status: Follow-up Date: 04/20/19 Follow-up type: In-patient    Danelle Earthly 04/20/2019, 5:49 AM

## 2019-04-20 NOTE — Discharge Instructions (Signed)
Vaginal Delivery, Care After °Refer to this sheet in the next few weeks. These instructions provide you with information about caring for yourself after vaginal delivery. Your health care provider may also give you more specific instructions. Your treatment has been planned according to current medical practices, but problems sometimes occur. Call your health care provider if you have any problems or questions. °What can I expect after the procedure? °After vaginal delivery, it is common to have: °· Some bleeding from your vagina. °· Soreness in your abdomen, your vagina, and the area of skin between your vaginal opening and your anus (perineum). °· Pelvic cramps. °· Fatigue. °Follow these instructions at home: °Medicines °· Take over-the-counter and prescription medicines only as told by your health care provider. °· If you were prescribed an antibiotic medicine, take it as told by your health care provider. Do not stop taking the antibiotic until it is finished. °Driving ° °· Do not drive or operate heavy machinery while taking prescription pain medicine. °· Do not drive for 24 hours if you received a sedative. °Lifestyle °· Do not drink alcohol. This is especially important if you are breastfeeding or taking medicine to relieve pain. °· Do not use tobacco products, including cigarettes, chewing tobacco, or e-cigarettes. If you need help quitting, ask your health care provider. °Eating and drinking °· Drink at least 8 eight-ounce glasses of water every day unless you are told not to by your health care provider. If you choose to breastfeed your baby, you may need to drink more water than this. °· Eat high-fiber foods every day. These foods may help prevent or relieve constipation. High-fiber foods include: °? Whole grain cereals and breads. °? Brown rice. °? Beans. °? Fresh fruits and vegetables. °Activity °· Return to your normal activities as told by your health care provider. Ask your health care provider what  activities are safe for you. °· Rest as much as possible. Try to rest or take a nap when your baby is sleeping. °· Do not lift anything that is heavier than your baby or 10 lb (4.5 kg) until your health care provider says that it is safe. °· Talk with your health care provider about when you can engage in sexual activity. This may depend on your: °? Risk of infection. °? Rate of healing. °? Comfort and desire to engage in sexual activity. °Vaginal Care °· If you have an episiotomy or a vaginal tear, check the area every day for signs of infection. Check for: °? More redness, swelling, or pain. °? More fluid or blood. °? Warmth. °? Pus or a bad smell. °· Do not use tampons or douches until your health care provider says this is safe. °· Watch for any blood clots that may pass from your vagina. These may look like clumps of dark red, brown, or black discharge. °General instructions °· Keep your perineum clean and dry as told by your health care provider. °· Wear loose, comfortable clothing. °· Wipe from front to back when you use the toilet. °· Ask your health care provider if you can shower or take a bath. If you had an episiotomy or a perineal tear during labor and delivery, your health care provider may tell you not to take baths for a certain length of time. °· Wear a bra that supports your breasts and fits you well. °· If possible, have someone help you with household activities and help care for your baby for at least a few days after you   leave the hospital. °· Keep all follow-up visits for you and your baby as told by your health care provider. This is important. °Contact a health care provider if: °· You have: °? Vaginal discharge that has a bad smell. °? Difficulty urinating. °? Pain when urinating. °? A sudden increase or decrease in the frequency of your bowel movements. °? More redness, swelling, or pain around your episiotomy or vaginal tear. °? More fluid or blood coming from your episiotomy or vaginal  tear. °? Pus or a bad smell coming from your episiotomy or vaginal tear. °? A fever. °? A rash. °? Little or no interest in activities you used to enjoy. °? Questions about caring for yourself or your baby. °· Your episiotomy or vaginal tear feels warm to the touch. °· Your episiotomy or vaginal tear is separating or does not appear to be healing. °· Your breasts are painful, hard, or turn red. °· You feel unusually sad or worried. °· You feel nauseous or you vomit. °· You pass large blood clots from your vagina. If you pass a blood clot from your vagina, save it to show to your health care provider. Do not flush blood clots down the toilet without having your health care provider look at them. °· You urinate more than usual. °· You are dizzy or light-headed. °· You have not breastfed at all and you have not had a menstrual period for 12 weeks after delivery. °· You have stopped breastfeeding and you have not had a menstrual period for 12 weeks after you stopped breastfeeding. °Get help right away if: °· You have: °? Pain that does not go away or does not get better with medicine. °? Chest pain. °? Difficulty breathing. °? Blurred vision or spots in your vision. °? Thoughts about hurting yourself or your baby. °· You develop pain in your abdomen or in one of your legs. °· You develop a severe headache. °· You faint. °· You bleed from your vagina so much that you fill two sanitary pads in one hour. °This information is not intended to replace advice given to you by your health care provider. Make sure you discuss any questions you have with your health care provider. °Document Released: 11/23/2000 Document Revised: 05/09/2016 Document Reviewed: 12/11/2015 °Elsevier Interactive Patient Education © 2019 Else[employer] Inc. ° °

## 2019-04-23 ENCOUNTER — Encounter: Payer: Medicaid Other | Admitting: Obstetrics and Gynecology

## 2019-04-25 ENCOUNTER — Inpatient Hospital Stay (HOSPITAL_COMMUNITY): Payer: Medicaid Other

## 2019-05-18 ENCOUNTER — Encounter: Payer: Self-pay | Admitting: Obstetrics and Gynecology

## 2019-05-18 ENCOUNTER — Other Ambulatory Visit: Payer: Self-pay

## 2019-05-18 ENCOUNTER — Ambulatory Visit (INDEPENDENT_AMBULATORY_CARE_PROVIDER_SITE_OTHER): Payer: Medicaid Other | Admitting: Obstetrics and Gynecology

## 2019-05-18 VITALS — BP 103/65 | HR 91 | Wt 194.0 lb

## 2019-05-18 DIAGNOSIS — Z30430 Encounter for insertion of intrauterine contraceptive device: Secondary | ICD-10-CM

## 2019-05-18 DIAGNOSIS — Z1389 Encounter for screening for other disorder: Secondary | ICD-10-CM

## 2019-05-18 DIAGNOSIS — Z01812 Encounter for preprocedural laboratory examination: Secondary | ICD-10-CM

## 2019-05-18 DIAGNOSIS — Z3202 Encounter for pregnancy test, result negative: Secondary | ICD-10-CM

## 2019-05-18 LAB — POCT URINE PREGNANCY: Preg Test, Ur: NEGATIVE

## 2019-05-18 MED ORDER — LEVONORGESTREL 19.5 MCG/DAY IU IUD
INTRAUTERINE_SYSTEM | Freq: Once | INTRAUTERINE | Status: AC
  Administered 2019-05-18: 11:00:00 via INTRAUTERINE

## 2019-05-18 NOTE — Progress Notes (Signed)
Post Partum Exam  Faith Woods is a 23 y.o. [phone] female who presents for a postpartum visit. She is 4 weeks postpartum following a spontaneous vaginal delivery. I have fully reviewed the prenatal and intrapartum course. The delivery was at 40 gestational weeks.  Anesthesia: epidural. Postpartum course has been complicated by no appetite. Baby's course has been doing well. Baby is feeding by both breast and bottle - gerber goodstart. Bleeding staining only. Bowel function is abnormal: constipation. Bladder function is normal. Patient is not sexually active. Contraception method is abstinence, desires IUD.  Postpartum depression screening:neg, score 2.    Last pap smear done 09/2018 and was Normal  Review of Systems Pertinent items are noted in HPI.    Objective:  Blood pressure 103/65, pulse 91, weight 194 lb (88 kg), unknown if currently breastfeeding.  General:  alert, cooperative and no distress   Breasts:  inspection negative, no nipple discharge or bleeding, no masses or nodularity palpable  Lungs: clear to auscultation bilaterally  Heart:  regular rate and rhythm  Abdomen: soft, non-tender; bowel sounds normal; no masses,  no organomegaly   Vulva:  normal  Vagina: normal vagina, no discharge, exudate, lesion, or erythema  Cervix:  multiparous appearance  Corpus: normal size, contour, position, consistency, mobility, non-tender  Adnexa:  normal adnexa and no mass, fullness, tenderness  Rectal Exam: Not performed.        Assessment:    Normal postpartum exam. Pap smear not done at today's visit.   Plan:   1. Contraception: IUD  IUD Procedure Note Patient identified, informed consent performed, signed copy in chart, time out was performed.  Urine pregnancy test negative.  Speculum placed in the vagina.  Cervix visualized.  Cleaned with Betadine x 2.  Grasped anteriorly with a single tooth tenaculum.  Uterus sounded to 8 cm.  Liletta IUD placed per manufacturer's  recommendations.  Strings trimmed to 3 cm. Tenaculum was removed, good hemostasis noted.  Patient tolerated procedure well.   Patient given post procedure instructions and Liletta care card with expiration date.  Patient is asked to check IUD strings periodically and follow up in 4-6 weeks for IUD check.   2. Patient is medically cleared to resume all activities of daily living 3. Follow up in: 4 weeks for IUD check or as needed.

## 2019-06-15 ENCOUNTER — Other Ambulatory Visit: Payer: Self-pay

## 2019-06-15 ENCOUNTER — Ambulatory Visit (INDEPENDENT_AMBULATORY_CARE_PROVIDER_SITE_OTHER): Payer: Medicaid Other | Admitting: Obstetrics & Gynecology

## 2019-06-15 ENCOUNTER — Encounter: Payer: Self-pay | Admitting: Obstetrics & Gynecology

## 2019-06-15 VITALS — BP 100/67 | HR 88 | Ht 66.0 in | Wt 207.3 lb

## 2019-06-15 DIAGNOSIS — Z30431 Encounter for routine checking of intrauterine contraceptive device: Secondary | ICD-10-CM | POA: Diagnosis not present

## 2019-06-15 DIAGNOSIS — T839XXA Unspecified complication of genitourinary prosthetic device, implant and graft, initial encounter: Secondary | ICD-10-CM

## 2019-06-15 MED ORDER — IBUPROFEN 600 MG PO TABS: 600.0000 mg | ORAL_TABLET | Freq: Four times a day (QID) | ORAL | 2 refills | Status: DC | PRN

## 2019-06-15 NOTE — Patient Instructions (Signed)
Return to clinic for any scheduled appointments or for any gynecologic concerns as needed.   

## 2019-06-15 NOTE — Progress Notes (Signed)
    GYNECOLOGY OFFICE ENCOUNTER NOTE  History:  23 y.o. [id number] here today for today for IUD string check; Liletta  IUD was placed  05/18/2019.  Since placement, she reports having pelvic pain 6-7/10 at the highest. Also reports increased anxiety and headaches; bad mood swings.  She feels she was okay postpartum before IUD placement. She had similar symptoms after using OCPs in the past. She can also feel strings when she squats, partner cannnot feel strings during intercourse.  No irregular bleeding or other concerns.  No other complaints about the IUD, no concerning side effects.  The following portions of the patient's history were reviewed and updated as appropriate: allergies, current medications, past family history, past medical history, past social history, past surgical history and problem list. Last pap smear on 09/23/2018 was normal.  Review of Systems:  Pertinent items are noted in HPI.   Objective:  Physical Exam Blood pressure 100/67, pulse 88, height 5\' 6"  (1.676 m), weight 207 lb 4.8 oz (94 kg), currently breastfeeding. CONSTITUTIONAL: Well-developed, well-nourished female in no acute distress.  HENT:  Normocephalic, atraumatic. External right and left ear normal. Oropharynx is clear and moist EYES: Conjunctivae and EOM are normal. Pupils are equal, round, and reactive to light. No scleral icterus.  NECK: Normal range of motion, supple, no masses CARDIOVASCULAR: Normal heart rate noted RESPIRATORY: Effort and breath sounds normal, no problems with respiration noted ABDOMEN: Soft, no distention noted, mild lower abdominal tenderness to palpation.   PELVIC: Normal appearing external genitalia; normal appearing vaginal mucosa and cervix.  Liletta blue IUD strings visualized, about 4 cm in length outside cervix.  Strings cut to 2 cm.  Assessment & Plan:  1. IUD check up 2. Symptoms related to intrauterine device (IUD), initial encounter [facility]) Patient was given option for removal  given her symptoms, she wants to think about this before doing that.  Also told to consider nonhormonal IUD (Paragard) or Kyleena (less progestin). Other reversible hormonal contraceptive modes will result in more systemic hormones and will cause more side effects, also they are less effective overall than the IUDs. She will make a decision and let us know in 1-2 weeks. Meanwhile Ibuprofen was ordered as needed for pelvic cramping and headaches, pain precautions reviewed.  - ibuprofen (ADVIL) 600 MG tablet; Take 1 tablet (600 mg total) by mouth every 6 (six) hours as needed for headache, moderate pain or cramping.  Dispense: 30 tablet; Refill: 2  Total face-to-face time with patient: 15 minutes. Over 50% of encounter was spent on counseling and coordination of care.    Verita Schneiders, MD, [place] for [employer], [place]

## 2019-06-15 NOTE — Progress Notes (Signed)
GYN presented for IUD String Check.  C/o feeling the strings whenever she squats, mood swings, anxiety, headache, abdominal pain 6-7/10

## 2019-07-02 ENCOUNTER — Telehealth: Payer: Self-pay

## 2019-07-02 NOTE — Telephone Encounter (Signed)
Returned call, pt is considering IUD removal and possibly a re-insertion, pt wants prices without insurance because she does not know if she still has medicaid. Advised pt to check with case worker and then call for appt, pt agreed.

## 2019-07-27 ENCOUNTER — Telehealth: Payer: Self-pay

## 2019-07-27 NOTE — Telephone Encounter (Signed)
Return call to pt w/ complaints of HA's, dizziness, feeling cold Abdominal cramps no vaginal bleeding.pain 08/10x Pt advised to check B/P while on phone 142/83 P:70  Repeated B/P: 130/83 P 90  Pt made aware she needs to go to urgent care or ER for cold sx's OTC Rx to help w/ sx's and once cleared she needs to schedule appt for IUD evaluation.  Pt voiced understanding and will call back for appt.

## 2019-08-19 ENCOUNTER — Encounter: Payer: Self-pay | Admitting: Obstetrics and Gynecology

## 2019-08-19 ENCOUNTER — Other Ambulatory Visit: Payer: Self-pay

## 2019-08-19 ENCOUNTER — Ambulatory Visit (INDEPENDENT_AMBULATORY_CARE_PROVIDER_SITE_OTHER): Payer: Medicaid Other | Admitting: Obstetrics and Gynecology

## 2019-08-19 VITALS — BP 99/61 | HR 84 | Ht 62.0 in | Wt 211.6 lb

## 2019-08-19 DIAGNOSIS — Z30432 Encounter for removal of intrauterine contraceptive device: Secondary | ICD-10-CM

## 2019-08-19 MED ORDER — NORETHINDRONE 0.35 MG PO TABS: 1.0000 | ORAL_TABLET | Freq: Every day | ORAL | 4 refills | Status: DC

## 2019-08-19 NOTE — Progress Notes (Signed)
GYNECOLOGY CLINIC PROCEDURE NOTE  Faith Woods is a 23 y.o. [phone] here for IUD removal and she reports persistent lower pelvic pain, ability to feel strings during intercourse and dizziness to the IUD which was inserted 05/2019. No GYN concerns.  Last pap smear was on 09/2018 and was normal.  IUD Removal  Patient identified, informed consent performed, consent signed.  Patient was in the dorsal lithotomy position, normal external genitalia was noted.  A speculum was placed in the patient's vagina, normal discharge was noted, no lesions. The cervix was visualized, no lesions, no abnormal discharge.  The strings of the IUD were grasped and pulled using ring forceps. The IUD was removed in its entirety.Patient tolerated the procedure well.    Patient will use progesterone only pills for contraception. Non hormonal forms of contraception such as condoms and Paraguard IUD also discussed. Routine preventative health maintenance measures emphasized.

## 2019-08-19 NOTE — Progress Notes (Signed)
GYN presents for IUD check.  C/o feeling strings during intercourse, dizziness, cramps 8/10 x 3 days.

## 2019-08-21 ENCOUNTER — Ambulatory Visit: Payer: Medicaid Other | Admitting: Medical

## 2019-10-20 ENCOUNTER — Other Ambulatory Visit (HOSPITAL_COMMUNITY)
Admission: RE | Admit: 2019-10-20 | Discharge: 2019-10-20 | Disposition: A | Discharge status: Home / Self Care | Payer: Medicaid Other | Source: Ambulatory Visit | Attending: Obstetrics and Gynecology | Admitting: Obstetrics and Gynecology

## 2019-10-20 ENCOUNTER — Ambulatory Visit (INDEPENDENT_AMBULATORY_CARE_PROVIDER_SITE_OTHER): Payer: Medicaid Other | Admitting: Obstetrics and Gynecology

## 2019-10-20 ENCOUNTER — Other Ambulatory Visit: Payer: Self-pay

## 2019-10-20 ENCOUNTER — Encounter: Payer: Self-pay | Admitting: Obstetrics and Gynecology

## 2019-10-20 VITALS — BP 104/67 | HR 92 | Wt 223.0 lb

## 2019-10-20 DIAGNOSIS — Z3009 Encounter for other general counseling and advice on contraception: Secondary | ICD-10-CM | POA: Diagnosis not present

## 2019-10-20 DIAGNOSIS — N939 Abnormal uterine and vaginal bleeding, unspecified: Secondary | ICD-10-CM | POA: Diagnosis not present

## 2019-10-20 DIAGNOSIS — N898 Other specified noninflammatory disorders of vagina: Secondary | ICD-10-CM

## 2019-10-20 MED ORDER — NORGESTIMATE-ETH ESTRADIOL 0.25-35 MG-MCG PO TABS: 1.0000 | ORAL_TABLET | Freq: Every day | ORAL | 11 refills | Status: DC

## 2019-10-20 MED ORDER — IBUPROFEN 600 MG PO TABS: 600.0000 mg | ORAL_TABLET | Freq: Four times a day (QID) | ORAL | 0 refills | Status: DC | PRN

## 2019-10-20 NOTE — Progress Notes (Signed)
Pt states irregular bleeding with clots since IUD removal.  Pt is having increase in cramping.  IUD was removed on 08/19/19.   Pt is sexually active, having some pain with intercourse.  Pt is having some vaginal irritation.   Pt is currently on Micronor and is breastfeeding.  Pt states she has had increase in HA's since being on pill.   Last pap 09/2018- normal

## 2019-10-20 NOTE — Progress Notes (Signed)
GYNECOLOGY OFFICE FOLLOW UP NOTE  History:  23 y.o. [id number] here today for follow up for heavy bleeding since IUD was removed. IUD removed 08/2019 and she has had irregular bleeding since. Has had 3 days of bleeding with a break, followed by a period-like bleeding after that. She is concerned this is not normal. Also having headaches that have only started since she started the Micronor. She is also feeling fatigued.  Would like to discuss switching contraception.  Past Medical History:  Diagnosis Date  . Ischemic bowel syndrome [facility])     Past Surgical History:  Procedure Laterality Date  . NO PAST SURGERIES       Current Outpatient Medications:  .  norethindrone (MICRONOR) 0.35 MG tablet, Take 1 tablet (0.35 mg total) by mouth daily., Disp: 3 Package, Rfl: 4 .  Prenatal Vit-Fe Fumarate-FA (PREPLUS) 27-1 MG TABS, Take 1 tablet by mouth daily., Disp: 30 tablet, Rfl: 13 .  ibuprofen (ADVIL) 600 MG tablet, Take 1 tablet (600 mg total) by mouth every 6 (six) hours as needed for headache, moderate pain or cramping., Disp: 30 tablet, Rfl: 2 .  ibuprofen (ADVIL) 600 MG tablet, Take 1 tablet (600 mg total) by mouth every 6 (six) hours as needed., Disp: 30 tablet, Rfl: 0 .  norgestimate-ethinyl estradiol (ORTHO-CYCLEN) 0.25-35 MG-MCG tablet, Take 1 tablet by mouth daily., Disp: 1 Package, Rfl: 11  The following portions of the patient's history were reviewed and updated as appropriate: allergies, current medications, past family history, past medical history, past social history, past surgical history and problem list.   Review of Systems:  Pertinent items noted in HPI and remainder of comprehensive ROS otherwise negative.   Objective:  Physical Exam BP 104/67   Pulse 92   Wt 223 lb (101.2 kg)   Breastfeeding Yes Comment: irregular cycle  BMI 40.79 kg/m  CONSTITUTIONAL: Well-developed, well-nourished female in no acute distress.  HENT:  Normocephalic, atraumatic. External right and  left ear normal. Oropharynx is clear and moist EYES: Conjunctivae and EOM are normal. Pupils are equal, round, and reactive to light. No scleral icterus.  NECK: Normal range of motion, supple, no masses SKIN: Skin is warm and dry. No rash noted. Not diaphoretic. No erythema. No pallor. NEUROLOGIC: Alert and oriented to person, place, and time. Normal reflexes, muscle tone coordination. No cranial nerve deficit noted. PSYCHIATRIC: Normal mood and affect. Normal behavior. Normal judgment and thought content. CARDIOVASCULAR: Normal heart rate noted RESPIRATORY: Effort normal, no problems with respiration noted ABDOMEN: Soft, no distention noted.   PELVIC: Normal appearing external genitalia; normal appearing vaginal mucosa and cervix.  No abnormal discharge noted.  pelvic cultures obtained.  MUSCULOSKELETAL: Normal range of motion. No edema noted.  Exam done with chaperone present.  Labs and Imaging No results found.  Assessment & Plan:   1. Vaginal discharge May be due to change in contraception Wet prep today  2. Encounter for counseling regarding contraception Reviewed POPs could be causing headache, risks/benefits to switching to OCPs, including possiblity of decreased milk supply. Reviewed risks of CHC including CV issues, stroke, VTE, hypertension.  Reviewed risks/benefits of contraception vs pregnancy. She verbalizes understanding of risks and desires [facility] for contraception. Script for sprintec sent to pharmacy.  3. Abnormal uterine bleeding (AUB) Very common after IUD removal, provided reassurance   Routine preventative health maintenance measures emphasized. Please refer to After Visit Summary for other counseling recommendations.   Return if symptoms worsen or fail to improve.  Total face-to-face time with  patient: 15 minutes. Over 50% of encounter was spent on counseling and coordination of care.  Feliz Beam, M.D. Attending [facility] for [employer] Field seismologist)

## 2019-10-20 NOTE — Patient Instructions (Addendum)
Vulvar Hygiene You should wear cotton brief underwear and avoid tight-fitting undergarments including avoiding underwear made of synthetic fabrics. You should keep area as dry as possible. Use non-irritating soaps and laundry detergents. Use as few products in vulvar area as possible as even products designed to improve irritation can cause more irritation. Do not use douches, lotions or vaginal sprays as these are very irritating.    Kegel Exercises  Kegel exercises can help strengthen your pelvic floor muscles. The pelvic floor is a group of muscles that support your rectum, small intestine, and bladder. In females, pelvic floor muscles also help support the womb (uterus). These muscles help you control the flow of urine and stool. Kegel exercises are painless and simple, and they do not require any equipment. Your provider may suggest Kegel exercises to:  Improve bladder and bowel control.  Improve sexual response.  Improve weak pelvic floor muscles after surgery to remove the uterus (hysterectomy) or pregnancy (females).  Improve weak pelvic floor muscles after prostate gland removal or surgery (males). Kegel exercises involve squeezing your pelvic floor muscles, which are the same muscles you squeeze when you try to stop the flow of urine or keep from passing gas. The exercises can be done while sitting, standing, or lying down, but it is best to vary your position. Exercises How to do Kegel exercises: 1. Squeeze your pelvic floor muscles tight. You should feel a tight lift in your rectal area. If you are a female, you should also feel a tightness in your vaginal area. Keep your stomach, buttocks, and legs relaxed. 2. Hold the muscles tight for up to 10 seconds. 3. Breathe normally. 4. Relax your muscles. 5. Repeat as told by your health care provider. Repeat this exercise daily as told by your health care provider. Continue to do this exercise for at least 4-6 weeks, or for as long as  told by your health care provider. You may be referred to a physical therapist who can help you learn more about how to do Kegel exercises. Depending on your condition, your health care provider may recommend:  Varying how long you squeeze your muscles.  Doing several sets of exercises every day.  Doing exercises for several weeks.  Making Kegel exercises a part of your regular exercise routine. This information is not intended to replace advice given to you by your health care provider. Make sure you discuss any questions you have with your health care provider. Document Released: 11/12/2012 Document Revised: 07/16/2018 Document Reviewed: 07/16/2018 Elsevier Patient Education  2020 [employer].

## 2019-10-21 LAB — CERVICOVAGINAL ANCILLARY ONLY
Bacterial Vaginitis (gardnerella): POSITIVE — AB
Candida Glabrata: NEGATIVE
Candida Vaginitis: NEGATIVE
Chlamydia: NEGATIVE
Comment: NEGATIVE
Comment: NEGATIVE
Comment: NEGATIVE
Comment: NEGATIVE
Comment: NEGATIVE
Comment: NORMAL
Neisseria Gonorrhea: NEGATIVE
Trichomonas: NEGATIVE

## 2019-10-26 MED ORDER — METRONIDAZOLE 500 MG PO TABS: 500.0000 mg | ORAL_TABLET | Freq: Two times a day (BID) | ORAL | 0 refills | Status: DC

## 2019-10-26 NOTE — Addendum Note (Signed)
Addended by: Vivien Rota on: 10/26/2019 10:50 PM   Modules accepted: Orders

## 2019-12-11 NOTE — L&D Delivery Note (Addendum)
  Delivery Note Called to room and patient was complete and pushing. Head delivered ROP. No nuchal cord present. Shoulder and body delivered in usual fashion. At  1440 a viable female was delivered via Vaginal, Spontaneous delivery. Infant with spontaneous cry, placed on mother's abdomen, dried and stimulated. Cord clamped x 2 after 1-minute delay, and cut by FOB. Cord blood drawn. Placenta delivered spontaneously with gentle cord traction. Appears intact. Fundus firm with massage and Pitocin. Labia, perineum, vagina, and cervix inspected.   APGAR: 9, 10; weight pending  .   Cord: 3VC  Cord pH: n/a  Anesthesia:  Epidural Episiotomy: None Lacerations: None- Intact Suture Repair: n/a Est. Blood Loss (mL): 400  Mom to postpartum.  Baby to Couplet care / Skin to Skin.  Acquanetta Belling, [place] 11/01/2020 3:02 PM    Midwife attestation: I was gloved and present for delivery in its entirety and I agree with the above student's note.  Donette Larry, CNM 4:03 PM

## 2020-03-10 ENCOUNTER — Emergency Department (HOSPITAL_COMMUNITY): Payer: Medicaid Other

## 2020-03-10 ENCOUNTER — Other Ambulatory Visit: Payer: Self-pay

## 2020-03-10 ENCOUNTER — Encounter (HOSPITAL_COMMUNITY): Payer: Self-pay

## 2020-03-10 ENCOUNTER — Inpatient Hospital Stay (HOSPITAL_COMMUNITY)
Admission: AD | Admit: 2020-03-10 | Discharge: 2020-03-10 | Disposition: A | Discharge status: Home / Self Care | Payer: Medicaid Other | Attending: Obstetrics & Gynecology | Admitting: Obstetrics & Gynecology

## 2020-03-10 ENCOUNTER — Inpatient Hospital Stay (HOSPITAL_COMMUNITY): Payer: Medicaid Other

## 2020-03-10 DIAGNOSIS — Z3491 Encounter for supervision of normal pregnancy, unspecified, first trimester: Secondary | ICD-10-CM

## 2020-03-10 DIAGNOSIS — R10815 Periumbilic abdominal tenderness: Secondary | ICD-10-CM | POA: Insufficient documentation

## 2020-03-10 DIAGNOSIS — Z20822 Contact with and (suspected) exposure to covid-19: Secondary | ICD-10-CM | POA: Insufficient documentation

## 2020-03-10 DIAGNOSIS — R10814 Left lower quadrant abdominal tenderness: Secondary | ICD-10-CM | POA: Diagnosis not present

## 2020-03-10 DIAGNOSIS — R10813 Right lower quadrant abdominal tenderness: Secondary | ICD-10-CM | POA: Diagnosis not present

## 2020-03-10 DIAGNOSIS — R05 Cough: Secondary | ICD-10-CM | POA: Diagnosis not present

## 2020-03-10 DIAGNOSIS — R109 Unspecified abdominal pain: Secondary | ICD-10-CM | POA: Diagnosis not present

## 2020-03-10 DIAGNOSIS — Z3A01 Less than 8 weeks gestation of pregnancy: Secondary | ICD-10-CM | POA: Diagnosis not present

## 2020-03-10 DIAGNOSIS — O219 Vomiting of pregnancy, unspecified: Secondary | ICD-10-CM

## 2020-03-10 DIAGNOSIS — O26891 Other specified pregnancy related conditions, first trimester: Secondary | ICD-10-CM | POA: Diagnosis not present

## 2020-03-10 DIAGNOSIS — M545 Low back pain: Secondary | ICD-10-CM | POA: Insufficient documentation

## 2020-03-10 DIAGNOSIS — Z79899 Other long term (current) drug therapy: Secondary | ICD-10-CM | POA: Insufficient documentation

## 2020-03-10 DIAGNOSIS — Z3A08 8 weeks gestation of pregnancy: Secondary | ICD-10-CM

## 2020-03-10 LAB — COMPREHENSIVE METABOLIC PANEL
ALT: 13 U/L (ref 0–44)
AST: 20 U/L (ref 15–41)
Albumin: 4.1 g/dL (ref 3.5–5.0)
Alkaline Phosphatase: 57 U/L (ref 38–126)
Anion gap: 9 (ref 5–15)
BUN: 5 mg/dL — ABNORMAL LOW (ref 6–20)
CO2: 22 mmol/L (ref 22–32)
Calcium: 9.2 mg/dL (ref 8.9–10.3)
Chloride: 104 mmol/L (ref 98–111)
Creatinine, Ser: 0.6 mg/dL (ref 0.44–1.00)
GFR calc Af Amer: >60 mL/min (ref >60)
GFR calc non Af Amer: >60 mL/min (ref >60)
Glucose, Bld: 101 mg/dL — ABNORMAL HIGH (ref 70–99)
Potassium: 4.5 mmol/L (ref 3.5–5.1)
Sodium: 135 mmol/L (ref 135–145)
Total Bilirubin: 0.7 mg/dL (ref 0.3–1.2)
Total Protein: 7.3 g/dL (ref 6.5–8.1)

## 2020-03-10 LAB — I-STAT BETA HCG BLOOD, ED (MC, WL, AP ONLY): I-stat hCG, quantitative: >2000 m[IU]/mL — ABNORMAL HIGH (ref <5)

## 2020-03-10 LAB — CBC WITH DIFFERENTIAL/PLATELET
Abs Immature Granulocytes: 0.01 10*3/uL (ref 0.00–0.07)
Basophils Absolute: 0 10*3/uL (ref 0.0–0.1)
Basophils Relative: 0 %
Eosinophils Absolute: 0.2 10*3/uL (ref 0.0–0.5)
Eosinophils Relative: 2 %
HCT: 40.4 % (ref 36.0–46.0)
Hemoglobin: 13 g/dL (ref 12.0–15.0)
Immature Granulocytes: 0 %
Lymphocytes Relative: 34 %
Lymphs Abs: 2.7 10*3/uL (ref 0.7–4.0)
MCH: 29.3 pg (ref 26.0–34.0)
MCHC: 32.2 g/dL (ref 30.0–36.0)
MCV: 91.2 fL (ref 80.0–100.0)
Monocytes Absolute: 0.7 10*3/uL (ref 0.1–1.0)
Monocytes Relative: 9 %
Neutro Abs: 4.3 10*3/uL (ref 1.7–7.7)
Neutrophils Relative %: 55 %
Platelets: 250 10*3/uL (ref 150–400)
RBC: 4.43 MIL/uL (ref 3.87–5.11)
RDW: 12.6 % (ref 11.5–15.5)
WBC: 7.9 10*3/uL (ref 4.0–10.5)
nRBC: 0 % (ref 0.0–0.2)

## 2020-03-10 LAB — WET PREP, GENITAL
Clue Cells Wet Prep HPF POC: NONE SEEN
Sperm: NONE SEEN
Trich, Wet Prep: NONE SEEN
Yeast Wet Prep HPF POC: NONE SEEN

## 2020-03-10 LAB — SARS CORONAVIRUS 2 (TAT 6-24 HRS): SARS Coronavirus 2: NEGATIVE

## 2020-03-10 LAB — URINALYSIS, ROUTINE W REFLEX MICROSCOPIC
Bilirubin Urine: NEGATIVE
Glucose, UA: NEGATIVE mg/dL
Hgb urine dipstick: NEGATIVE
Ketones, ur: NEGATIVE mg/dL
Leukocytes,Ua: NEGATIVE
Nitrite: NEGATIVE
Protein, ur: NEGATIVE mg/dL
Specific Gravity, Urine: 1.02 (ref 1.005–1.030)
pH: 5 (ref 5.0–8.0)

## 2020-03-10 LAB — POC SARS CORONAVIRUS 2 AG -  ED: SARS Coronavirus 2 Ag: NEGATIVE

## 2020-03-10 LAB — HCG, QUANTITATIVE, PREGNANCY: hCG, Beta Chain, Quant, S: 35442 m[IU]/mL — ABNORMAL HIGH (ref <5)

## 2020-03-10 LAB — LIPASE, BLOOD: Lipase: 29 U/L (ref 11–51)

## 2020-03-10 MED ORDER — ACETAMINOPHEN 325 MG PO TABS
650.0000 mg | ORAL_TABLET | Freq: Once | ORAL | Status: AC
  Administered 2020-03-10: 09:00:00 650 mg via ORAL
  Filled 2020-03-10: qty 2

## 2020-03-10 MED ORDER — SODIUM CHLORIDE 0.9 % IV BOLUS
1000.0000 mL | Freq: Once | INTRAVENOUS | Status: AC
  Administered 2020-03-10: 1000 mL via INTRAVENOUS

## 2020-03-10 MED ORDER — ONDANSETRON HCL 4 MG/2ML IJ SOLN
4.0000 mg | Freq: Once | INTRAMUSCULAR | Status: AC
  Administered 2020-03-10: 08:00:00 4 mg via INTRAVENOUS
  Filled 2020-03-10: qty 2

## 2020-03-10 MED ORDER — HYDROMORPHONE HCL 1 MG/ML IJ SOLN: 1.0000 mg | Freq: Once | INTRAMUSCULAR | Status: DC

## 2020-03-10 MED ORDER — PROMETHAZINE HCL 25 MG PO TABS: 25.0000 mg | ORAL_TABLET | Freq: Four times a day (QID) | ORAL | 0 refills | Status: DC | PRN

## 2020-03-10 NOTE — ED Triage Notes (Signed)
Pt states she stared with vomiting and weakness last week, This week she began coughing on Tuesday and reports clear sputum when coughing. Pt denies any fever or diarrhea.

## 2020-03-10 NOTE — ED Provider Notes (Signed)
[facility] EMERGENCY DEPARTMENT Provider Note   CSN: [id number] Arrival date & time: 03/10/20  2778     History Chief Complaint  Patient presents with  . Emesis  . Weakness  . Cough    Faith Woods is a 24 y.o. female.  HPI 24 year old female presents with vomiting. On and off for a week. Doesn't vomit every day, but will often vomit a couple times a day. No fevers but has had chills. intermittent moderate lower abdominal pain. Some low back pain. No urinary symptoms, vaginal bleeding or vaginal discharge. LMP February. Also, has noticed some cough and congestion since yesterday. No hematemesis.   Past Medical History:  Diagnosis Date  . Ischemic bowel syndrome [facility])     Patient Active Problem List   Diagnosis Date Noted  . Post-dates pregnancy 04/18/2019  . Umbilical cord cyst during pregnancy, antepartum 01/21/2019  . History of preterm delivery, currently pregnant 11/18/2018  . Supervision of other normal pregnancy, antepartum 09/23/2018  . Nausea/vomiting in pregnancy 09/08/2018    Past Surgical History:  Procedure Laterality Date  . NO PAST SURGERIES       OB History    Gravida  3   Para  3   Term  2   Preterm  1   AB      Living  3     SAB      TAB      Ectopic      Multiple  0   Live Births  3           History reviewed. No pertinent family history.  Social History   Tobacco Use  . Smoking status: Never Smoker  . Smokeless tobacco: Never Used  Substance Use Topics  . Alcohol use: No  . Drug use: No    Home Medications Prior to Admission medications   Medication Sig Start Date End Date Taking? Authorizing Provider  ibuprofen (ADVIL) 600 MG tablet Take 1 tablet (600 mg total) by mouth every 6 (six) hours as needed for headache, moderate pain or cramping. 06/15/19   Anyanwu, Jethro Bastos, MD  ibuprofen (ADVIL) 600 MG tablet Take 1 tablet (600 mg total) by mouth every 6 (six) hours as needed. 10/20/19    Conan Bowens, MD  metroNIDAZOLE (FLAGYL) 500 MG tablet Take 1 tablet (500 mg total) by mouth 2 (two) times daily. 10/26/19   Conan Bowens, MD  norethindrone (MICRONOR) 0.35 MG tablet Take 1 tablet (0.35 mg total) by mouth daily. 08/19/19   Constant, Peggy, MD  norgestimate-ethinyl estradiol (ORTHO-CYCLEN) 0.25-35 MG-MCG tablet Take 1 tablet by mouth daily. 10/20/19   Conan Bowens, MD  Prenatal Vit-Fe Fumarate-FA (PREPLUS) 27-1 MG TABS Take 1 tablet by mouth daily. 09/23/18   Conan Bowens, MD    Allergies    Patient has no known allergies.  Review of Systems   Review of Systems  Constitutional: Positive for chills. Negative for fever.  HENT: Positive for congestion and sore throat.   Respiratory: Positive for cough.   Gastrointestinal: Positive for abdominal pain, nausea and vomiting. Negative for diarrhea.  Genitourinary: Negative for dysuria, vaginal bleeding and vaginal discharge.  All other systems reviewed and are negative.   Physical Exam Updated Vital Signs BP 121/76 (BP Location: Right Arm)   Pulse 77   Temp 97.8 F (36.6 C) (Oral)   Resp 16   Ht 5\' 5"  (1.651 m)   Wt 97.1 kg   LMP 01/14/2020 (  Exact Date)   SpO2 100%   BMI 35.61 kg/m   Physical Exam Vitals and nursing note reviewed.  Constitutional:      General: She is not in acute distress.    Appearance: She is well-developed. She is obese. She is not ill-appearing or diaphoretic.  HENT:     Head: Normocephalic and atraumatic.     Right Ear: External ear normal.     Left Ear: External ear normal.     Nose: Nose normal.  Eyes:     General:        Right eye: No discharge.        Left eye: No discharge.  Cardiovascular:     Rate and Rhythm: Normal rate and regular rhythm.     Heart sounds: Normal heart sounds.  Pulmonary:     Effort: Pulmonary effort is normal.     Breath sounds: Normal breath sounds. No wheezing or rales.  Abdominal:     Palpations: Abdomen is soft.     Tenderness: There is  abdominal tenderness in the right lower quadrant, suprapubic area and left lower quadrant. There is no right CVA tenderness or left CVA tenderness.  Skin:    General: Skin is warm and dry.  Neurological:     Mental Status: She is alert.  Psychiatric:        Mood and Affect: Mood is not anxious.     ED Results / Procedures / Treatments   Labs (all labs ordered are listed, but only abnormal results are displayed) Labs Reviewed  I-STAT BETA HCG BLOOD, ED ([facility], [facility], [facility] ONLY) - Abnormal; Notable for the following components:      Result Value   I-stat hCG, quantitative >2,000.0 (*)    All other components within normal limits  SARS CORONAVIRUS 2 (TAT 6-24 HRS)  COMPREHENSIVE METABOLIC PANEL  LIPASE, BLOOD  CBC WITH DIFFERENTIAL/PLATELET  POC SARS CORONAVIRUS 2 AG -  ED    EKG None  Radiology No results found.  Procedures Procedures (including critical care time)  Medications Ordered in ED Medications  acetaminophen (TYLENOL) tablet 650 mg (has no administration in time range)  sodium chloride 0.9 % bolus 1,000 mL (1,000 mLs Intravenous New Bag/Given 03/10/20 0743)  ondansetron (ZOFRAN) injection 4 mg (4 mg Intravenous Given 03/10/20 0743)    ED Course  I have reviewed the triage vital signs and the nursing notes.  Pertinent labs & imaging results that were available during my care of the patient were reviewed by me and considered in my medical decision making (see chart for details).    MDM Rules/Calculators/A&P                      Patient has some mild diffuse lower abdominal tenderness.  Her pregnancy test is positive.  Given this, she will need ultrasound to rule out ectopic pregnancy.  She is otherwise hemodynamically stable.  She also reports some mild URI symptoms so Covid testing was ordered and the point-of-care is negative.  Covid is probably less likely with no fever.  I discussed with Erin, MAU provider, who accepts in transfer to the MAU for further pregnancy  work-up.  Faith Woods was evaluated in Emergency Department on 03/10/2020 for the symptoms described in the history of present illness. She was evaluated in the context of the global COVID-19 pandemic, which necessitated consideration that the patient might be at risk for infection with the SARS-CoV-2 virus that causes COVID-19. Institutional protocols and algorithms  that pertain to the evaluation of patients at risk for COVID-19 are in a state of rapid change based on information released by regulatory bodies including the CDC and federal and state organizations. These policies and algorithms were followed during the patient's care in the ED.  Final Clinical Impression(s) / ED Diagnoses Final diagnoses:  Vomiting affecting pregnancy    Rx / DC Orders ED Discharge Orders    None       Pricilla Loveless, MD 03/10/20 [phone]

## 2020-03-10 NOTE — Discharge Instructions (Signed)
Safe Medications in Pregnancy   Acne: Benzoyl Peroxide Salicylic Acid  Backache/Headache: Tylenol: 2 regular strength every 4 hours OR              2 Extra strength every 6 hours  Colds/Coughs/Allergies: Benadryl (alcohol free) 25 mg every 6 hours as needed Breath right strips Claritin Cepacol throat lozenges Chloraseptic throat spray Cold-Eeze- up to three times per day Cough drops, alcohol free Flonase (by prescription only) Guaifenesin Mucinex Robitussin DM (plain only, alcohol free) Saline nasal spray/drops Sudafed (pseudoephedrine) & Actifed ** use only after [redacted] weeks gestation and if you do not have high blood pressure Tylenol Vicks Vaporub Zinc lozenges Zyrtec   Constipation: Colace Ducolax suppositories Fleet enema Glycerin suppositories Metamucil Milk of magnesia Miralax Senokot Smooth move tea  Diarrhea: Kaopectate Imodium A-D  *NO pepto Bismol  Hemorrhoids: Anusol Anusol HC Preparation H Tucks  Indigestion: Tums Maalox Mylanta Zantac  Pepcid  Insomnia: Benadryl (alcohol free) 25mg every 6 hours as needed Tylenol PM Unisom, no Gelcaps  Leg Cramps: Tums MagGel  Nausea/Vomiting:  Bonine Dramamine Emetrol Ginger extract Sea bands Meclizine  Nausea medication to take during pregnancy:  Unisom (doxylamine succinate 25 mg tablets) Take one tablet daily at bedtime. If symptoms are not adequately controlled, the dose can be increased to a maximum recommended dose of two tablets daily (1/2 tablet in the morning, 1/2 tablet mid-afternoon and one at bedtime). Vitamin B6 100mg tablets. Take one tablet twice a day (up to 200 mg per day).  Skin Rashes: Aveeno products Benadryl cream or 25mg every 6 hours as needed Calamine Lotion 1% cortisone cream  Yeast infection: Gyne-lotrimin 7 Monistat 7  Gum/tooth pain: Anbesol  **If taking multiple medications, please check labels to avoid duplicating the same active ingredients **take  medication as directed on the label ** Do not exceed 4000 mg of tylenol in 24 hours **Do not take medications that contain aspirin or ibuprofen    Morning Sickness  Morning sickness is when a woman feels nauseous during pregnancy. This nauseous feeling may or may not come with vomiting. It often occurs in the morning, but it can be a problem at any time of day. Morning sickness is most common during the first trimester. In some cases, it may continue throughout pregnancy. Although morning sickness is unpleasant, it is usually harmless unless the woman develops severe and continual vomiting (hyperemesis gravidarum), a condition that requires more intense treatment. What are the causes? The exact cause of this condition is not known, but it seems to be related to normal hormonal changes that occur in pregnancy. What increases the risk? You are more likely to develop this condition if:  You experienced nausea or vomiting before your pregnancy.  You had morning sickness during a previous pregnancy.  You are pregnant with more than one baby, such as twins. What are the signs or symptoms? Symptoms of this condition include:  Nausea.  Vomiting. How is this diagnosed? This condition is usually diagnosed based on your signs and symptoms. How is this treated? In many cases, treatment is not needed for this condition. Making some changes to what you eat may help to control symptoms. Your health care provider may also prescribe or recommend:  Vitamin B6 supplements.  Anti-nausea medicines.  Ginger. Follow these instructions at home: Medicines  Take over-the-counter and prescription medicines only as told by your health care provider. Do not use any prescription, over-the-counter, or herbal medicines for morning sickness without first talking with your health care   provider.  Taking multivitamins before getting pregnant can prevent or decrease the severity of morning sickness in most  women. Eating and drinking  Eat a piece of dry toast or crackers before getting out of bed in the morning.  Eat 5 or 6 small meals a day.  Eat dry and bland foods, such as rice or a baked potato. Foods that are high in carbohydrates are often helpful.  Avoid greasy, fatty, and spicy foods.  Have someone cook for you if the smell of any food causes nausea and vomiting.  If you feel nauseous after taking prenatal vitamins, take the vitamins at night or with a snack.  Snack on protein foods between meals if you are hungry. Nuts, yogurt, and cheese are good options.  Drink fluids throughout the day.  Try ginger ale made with real ginger, ginger tea made from fresh grated ginger, or ginger candies. General instructions  Do not use any products that contain nicotine or tobacco, such as cigarettes and e-cigarettes. If you need help quitting, ask your health care provider.  Get an air purifier to keep the air in your house free of odors.  Get plenty of fresh air.  Try to avoid odors that trigger your nausea.  Consider trying these methods to help relieve symptoms: ? Wearing an acupressure wristband. These wristbands are often worn for seasickness. ? Acupuncture. Contact a health care provider if:  Your home remedies are not working and you need medicine.  You feel dizzy or light-headed.  You are losing weight. Get help right away if:  You have persistent and uncontrolled nausea and vomiting.  You faint.  You have severe pain in your abdomen. Summary  Morning sickness is when a woman feels nauseous during pregnancy. This nauseous feeling may or may not come with vomiting.  Morning sickness is most common during the first trimester.  It often occurs in the morning, but it can be a problem at any time of day.  In many cases, treatment is not needed for this condition. Making some changes to what you eat may help to control symptoms. This information is not intended to  replace advice given to you by your health care provider. Make sure you discuss any questions you have with your health care provider. Document Revised: 11/08/2017 Document Reviewed: 12/29/2016 Elsevier Patient Education  2020 [employer]   

## 2020-03-10 NOTE — MAU Provider Note (Signed)
History     CSN: [id number]  Arrival date and time: 03/10/20 5956   First Provider Initiated Contact with Patient 03/10/20 [phone]      Chief Complaint  Patient presents with  . Emesis  . Abdominal Pain   HPI   Faith Woods is a 24 year old [id number], [redacted]w[redacted]d, presenting today for vomiting, lower abdominal pain, and lower back pain. Pt went to  ED this AM for her symptoms where the pt found out she was pregnant. Pt has had nausea and vomiting on and off since last week, last time she vomited was yesterday 1100. Pt states she has felt weak and chill but denies fever. Pt states her lower abdominal pain and low back pain has been on and off for the past 2 weeks, lying down and taking ibuprofen helps her pain. Patient rates lower back pain and lower abdominal pain a 6/10 when it occurs. Patient confirms urinary frequency but denies dysuria and hematuria.   OB History    Gravida  4   Para  3   Term  2   Preterm  1   AB      Living  3     SAB      TAB      Ectopic      Multiple  0   Live Births  3           Past Medical History:  Diagnosis Date  . Ischemic bowel syndrome [facility])     Past Surgical History:  Procedure Laterality Date  . NO PAST SURGERIES      Family History  Problem Relation Age of Onset  . Healthy Mother   . Healthy Father     Social History   Tobacco Use  . Smoking status: Never Smoker  . Smokeless tobacco: Never Used  Substance Use Topics  . Alcohol use: No  . Drug use: No    Allergies: No Known Allergies  Medications Prior to Admission  Medication Sig Dispense Refill Last Dose  . ibuprofen (ADVIL) 600 MG tablet Take 1 tablet (600 mg total) by mouth every 6 (six) hours as needed for headache, moderate pain or cramping. 30 tablet 2   . ibuprofen (ADVIL) 600 MG tablet Take 1 tablet (600 mg total) by mouth every 6 (six) hours as needed. 30 tablet 0   . metroNIDAZOLE (FLAGYL) 500 MG tablet Take 1 tablet (500 mg total) by mouth 2 (two)  times daily. 14 tablet 0   . norethindrone (MICRONOR) 0.35 MG tablet Take 1 tablet (0.35 mg total) by mouth daily. 3 Package 4   . norgestimate-ethinyl estradiol (ORTHO-CYCLEN) 0.25-35 MG-MCG tablet Take 1 tablet by mouth daily. 1 Package 11   . Prenatal Vit-Fe Fumarate-FA (PREPLUS) 27-1 MG TABS Take 1 tablet by mouth daily. 30 tablet 13     Review of Systems  Constitutional: Positive for chills and fatigue. Negative for fever.  Gastrointestinal: Positive for abdominal pain, nausea and vomiting.  Genitourinary: Positive for flank pain and frequency. Negative for difficulty urinating, dysuria, hematuria, pelvic pain, urgency, vaginal bleeding, vaginal discharge and vaginal pain.  Musculoskeletal: Positive for back pain.  Neurological: Positive for weakness.   Physical Exam   Blood pressure 117/61, pulse 81, temperature 97.9 F (36.6 C), temperature source Oral, resp. rate 20, height 5\' 5"  (1.651 m), weight 97.1 kg, last menstrual period 01/14/2020, SpO2 100 %, currently breastfeeding.  Physical Exam  Constitutional: She is oriented to person, place, and time. She appears well-developed  and well-nourished. No distress.  HENT:  Head: Normocephalic and atraumatic.  Right Ear: External ear normal.  Left Ear: External ear normal.  Eyes: Pupils are equal, round, and reactive to light. Conjunctivae and EOM are normal. Right eye exhibits no discharge. Left eye exhibits no discharge.  Respiratory: Effort normal. No respiratory distress.  GI: Soft. She exhibits no distension and no mass. There is abdominal tenderness (LLQ TTP). There is no rebound and no guarding.  Genitourinary:    Vagina and uterus normal.  There is no rash or tenderness on the right labia. There is no rash or tenderness on the left labia. Cervix exhibits no motion tenderness and no discharge. Right adnexum displays no mass and no tenderness. Left adnexum displays no mass and no tenderness.    No vaginal discharge, tenderness or  bleeding.  No tenderness or bleeding in the vagina.  Musculoskeletal:        General: Normal range of motion.     Cervical back: Normal range of motion and neck supple.  Neurological: She is alert and oriented to person, place, and time.  Skin: Skin is warm and dry.  Psychiatric: She has a normal mood and affect. Her behavior is normal. Judgment and thought content normal.    MAU Course  Procedures  MDM Transvaginal US U/A b hcg Bimanual exam Gc/Chlamydia Wet Prep  Assessment and Plan   Vomiting affecting pregnancy - Plan: Discharge patient  Abdominal pain during pregnancy in first trimester - Plan: US OB LESS THAN 14 WEEKS WITH OB TRANSVAGINAL, US OB LESS THAN 14 WEEKS WITH OB TRANSVAGINAL, Discharge patient  Normal IUP (intrauterine pregnancy) on prenatal ultrasound, first trimester - Plan: Discharge patient   Nicole Kindred PA-S 03/10/2020, 11:08 AM

## 2020-03-10 NOTE — MAU Note (Signed)
Pt sent from ED for evaluation lower abdominal pain secondary early pregnant.  Denies VB, endorses lower left abdominal & back pain.

## 2020-03-10 NOTE — MAU Provider Note (Signed)
Chief Complaint: Emesis and Abdominal Pain   First Provider Initiated Contact with Patient 03/10/20 0910     SUBJECTIVE HPI: Faith Woods is a 24 y.o. [id number] at 52w0dwho presents to Maternity Admissions reporting abdominal cramping & n/v. [facility]. [facility] for same plus cough & chills. Had workup for those symptoms & negative COVID/SARS testing but had a positive istat HCG so was sent here for pregnancy evaluation.  Reports daily n/v for the last few weeks and lower abdominal pain that is worse in the LLQ & radiates to her low back. Has had some urinary frequency but no other urinary symptoms. Denies vaginal bleeding or change in discharge. Was on OCPs.   Location: abdomen Quality: cramping Severity: 7/10 on pain scale Duration: 2 weeks Timing: intermittent Modifying factors: none Associated signs and symptoms: n/v  Past Medical History:  Diagnosis Date  . Ischemic bowel syndrome ([place]    OB History  Gravida Para Term Preterm AB Living  '4 3 2 1   3  ' SAB TAB Ectopic Multiple Live Births        0 3    # Outcome Date GA Lbr Len/2nd Weight Sex Delivery Anes PTL Lv  4 Current           3 Term 04/18/19 447w0d0:50 / 00:23 3666 g M Vag-Spont EPI  LIV     Birth Comments: none  2 Term 07/15/16 3943w5d00:11 3440 g F Vag-Spont None  LIV     Birth Comments: WNL   1 Preterm 11/16/12 30w43w0d4 g M Vag-Spont None Y LIV   Past Surgical History:  Procedure Laterality Date  . NO PAST SURGERIES     Social History   Socioeconomic History  . Marital status: Married    Spouse name: Not on file  . Number of children: Not on file  . Years of education: Not on file  . Highest education level: Not on file  Occupational History  . Not on file  Tobacco Use  . Smoking status: Never Smoker  . Smokeless tobacco: Never Used  Substance and Sexual Activity  . Alcohol use: No  . Drug use: No  . Sexual activity: Yes    Birth  control/protection: None    Comment: last sex 06 Sep 2018  Other Topics Concern  . Not on file  Social History Narrative  . Not on file   Social Determinants of Health   Financial Resource Strain:   . Difficulty of Paying Living Expenses:   Food Insecurity:   . Worried About RunnCharity fundraiserthe Last Year:   . Ran Arboriculturistthe Last Year:   Transportation Needs:   . LackFilm/video editordical):   . LaMarland Kitchenk of Transportation (Non-Medical):   Physical Activity:   . Days of Exercise per Week:   . Minutes of Exercise per Session:   Stress:   . Feeling of Stress :   Social Connections:   . Frequency of Communication with Friends and Family:   . Frequency of Social Gatherings with Friends and Family:   . Attends Religious Services:   . Active Member of Clubs or Organizations:   . Attends ClubArchivisttings:   . MaMarland Kitchenital Status:   Intimate Partner Violence:   . Fear of Current or Ex-Partner:   . Emotionally Abused:   . PhMarland Kitchensically Abused:   . Sexually Abused:    Family History  Problem Relation Age of Onset  . Healthy Mother   . Healthy Father    No current facility-administered medications on file prior to encounter.   Current Outpatient Medications on File Prior to Encounter  Medication Sig Dispense Refill  . ibuprofen (ADVIL) 600 MG tablet Take 1 tablet (600 mg total) by mouth every 6 (six) hours as needed for headache, moderate pain or cramping. 30 tablet 2  . ibuprofen (ADVIL) 600 MG tablet Take 1 tablet (600 mg total) by mouth every 6 (six) hours as needed. 30 tablet 0  . metroNIDAZOLE (FLAGYL) 500 MG tablet Take 1 tablet (500 mg total) by mouth 2 (two) times daily. 14 tablet 0  . norethindrone (MICRONOR) 0.35 MG tablet Take 1 tablet (0.35 mg total) by mouth daily. 3 Package 4  . norgestimate-ethinyl estradiol (ORTHO-CYCLEN) 0.25-35 MG-MCG tablet Take 1 tablet by mouth daily. 1 Package 11  . Prenatal Vit-Fe Fumarate-FA (PREPLUS) 27-1 MG TABS Take  1 tablet by mouth daily. 30 tablet 13   No Known Allergies  I have reviewed patient's Past Medical Hx, Surgical Hx, Family Hx, Social Hx, medications and allergies.   Review of Systems  Constitutional: Positive for chills. Negative for fever.  Respiratory: Positive for cough.   Gastrointestinal: Positive for abdominal pain, nausea and vomiting. Negative for constipation and diarrhea.  Genitourinary: Negative.     OBJECTIVE Patient Vitals for the past 24 hrs:  BP Temp Temp src Pulse Resp SpO2 Height Weight  03/10/20 0850 115/66 97.9 F (36.6 C) Oral 72 20 100 % -- --  03/10/20 0800 95/77 -- -- 70 -- 100 % -- --  03/10/20 0718 121/76 97.8 F (36.6 C) Oral 77 16 100 % -- --  03/10/20 0700 111/67 -- -- 72 -- 100 % -- --  03/10/20 0621 119/80 97.6 F (36.4 C) Oral 83 16 100 % '5\' 5"'  (1.651 m) 97.1 kg   Constitutional: Well-developed, well-nourished female in no acute distress.  Cardiovascular: normal rate & rhythm, no murmur Respiratory: normal rate and effort. Lung sounds clear throughout GI: Abd soft, non-tender, Pos BS x 4. No guarding or rebound tenderness MS: Extremities nontender, no edema, normal ROM Neurologic: Alert and oriented x 4.  GU: NEFG, no blood, cervix closed.    LAB RESULTS Results for orders placed or performed during the hospital encounter of 03/10/20 (from the past 24 hour(s))  Comprehensive metabolic panel     Status: Abnormal   Collection Time: 03/10/20  7:30 AM  Result Value Ref Range   Sodium 135 135 - 145 mmol/L   Potassium 4.5 3.5 - 5.1 mmol/L   Chloride 104 98 - 111 mmol/L   CO2 22 22 - 32 mmol/L   Glucose, Bld 101 (H) 70 - 99 mg/dL   BUN 5 (L) 6 - 20 mg/dL   Creatinine, Ser 0.60 0.44 - 1.00 mg/dL   Calcium 9.2 8.9 - 10.3 mg/dL   Total Protein 7.3 6.5 - 8.1 g/dL   Albumin 4.1 3.5 - 5.0 g/dL   AST 20 15 - 41 U/L   ALT 13 0 - 44 U/L   Alkaline Phosphatase 57 38 - 126 U/L   Total Bilirubin 0.7 0.3 - 1.2 mg/dL   GFR calc non Af Amer >60 >60  mL/min   GFR calc Af Amer >60 >60 mL/min   Anion gap 9 5 - 15  Lipase, blood     Status: None   Collection Time: 03/10/20  7:30 AM  Result Value Ref Range  Lipase 29 11 - 51 U/L  CBC with Differential     Status: None   Collection Time: 03/10/20  7:30 AM  Result Value Ref Range   WBC 7.9 4.0 - 10.5 K/uL   RBC 4.43 3.87 - 5.11 MIL/uL   Hemoglobin 13.0 12.0 - 15.0 g/dL   HCT 40.4 36.0 - 46.0 %   MCV 91.2 80.0 - 100.0 fL   MCH 29.3 26.0 - 34.0 pg   MCHC 32.2 30.0 - 36.0 g/dL   RDW 12.6 11.5 - 15.5 %   Platelets 250 150 - 400 K/uL   nRBC 0.0 0.0 - 0.2 %   Neutrophils Relative % 55 %   Neutro Abs 4.3 1.7 - 7.7 K/uL   Lymphocytes Relative 34 %   Lymphs Abs 2.7 0.7 - 4.0 K/uL   Monocytes Relative 9 %   Monocytes Absolute 0.7 0.1 - 1.0 K/uL   Eosinophils Relative 2 %   Eosinophils Absolute 0.2 0.0 - 0.5 K/uL   Basophils Relative 0 %   Basophils Absolute 0.0 0.0 - 0.1 K/uL   Immature Granulocytes 0 %   Abs Immature Granulocytes 0.01 0.00 - 0.07 K/uL  hCG, quantitative, pregnancy     Status: Abnormal   Collection Time: 03/10/20  7:30 AM  Result Value Ref Range   hCG, Beta Chain, Quant, S 35,442 (H) <5 mIU/mL  I-Stat beta hCG blood, ED     Status: Abnormal   Collection Time: 03/10/20  7:51 AM  Result Value Ref Range   I-stat hCG, quantitative >2,000.0 (H) <5 mIU/mL   Comment 3          POC SARS Coronavirus 2 Ag-ED -     Status: None   Collection Time: 03/10/20  8:09 AM  Result Value Ref Range   SARS Coronavirus 2 Ag NEGATIVE NEGATIVE  Urinalysis, Routine w reflex microscopic     Status: Abnormal   Collection Time: 03/10/20  9:50 AM  Result Value Ref Range   Color, Urine YELLOW YELLOW   APPearance HAZY (A) CLEAR   Specific Gravity, Urine 1.020 1.005 - 1.030   pH 5.0 5.0 - 8.0   Glucose, UA NEGATIVE NEGATIVE mg/dL   Hgb urine dipstick NEGATIVE NEGATIVE   Bilirubin Urine NEGATIVE NEGATIVE   Ketones, ur NEGATIVE NEGATIVE mg/dL   Protein, ur NEGATIVE NEGATIVE mg/dL   Nitrite  NEGATIVE NEGATIVE   Leukocytes,Ua NEGATIVE NEGATIVE  Wet prep, genital     Status: Abnormal   Collection Time: 03/10/20  9:50 AM   Specimen: PATH Cytology Cervicovaginal Ancillary Only  Result Value Ref Range   Yeast Wet Prep HPF POC NONE SEEN NONE SEEN   Trich, Wet Prep NONE SEEN NONE SEEN   Clue Cells Wet Prep HPF POC NONE SEEN NONE SEEN   WBC, Wet Prep HPF POC MODERATE (A) NONE SEEN   Sperm NONE SEEN     IMAGING US OB LESS THAN 14 WEEKS WITH OB TRANSVAGINAL  Result Date: 03/10/2020 CLINICAL DATA:  Pelvic pain.  Positive pregnancy test. EXAM: OBSTETRIC <14 WK [place] AND TRANSVAGINAL OB US TECHNIQUE: Both transabdominal and transvaginal ultrasound examinations were performed for complete evaluation of the gestation as well as the maternal uterus, adnexal regions, and pelvic cul-de-sac. Transvaginal technique was performed to assess early pregnancy. COMPARISON:  None. FINDINGS: Intrauterine gestational sac: Present Yolk sac:  Present Embryo:  Present Cardiac Activity: Present Heart Rate: 117 bpm CRL:  3.7 mm   6 w   0 d  [place] EDC: 11/03/2020 Subchorionic hemorrhage:  None visualized. Maternal uterus/adnexae: Normal ovaries. Corpus luteum cyst noted on the right. IMPRESSION: Living intrauterine embryo estimated at 6 weeks and 0 days gestation. No subchorionic hemorrhage. Normal ovaries.  Corpus luteum cyst noted on the right. Electronically Signed   By: Marijo Sanes M.D.   On: 03/10/2020 10:43    MAU COURSE Orders Placed This Encounter  Procedures  . SARS CORONAVIRUS 2 (TAT 6-24 HRS) Nasopharyngeal Nasopharyngeal Swab  . Wet prep, genital  . US OB LESS THAN 14 WEEKS WITH OB TRANSVAGINAL  . Comprehensive metabolic panel  . Lipase, blood  . CBC with Differential  . hCG, quantitative, pregnancy  . Urinalysis, Routine w reflex microscopic  . Diet NPO time specified  . I-Stat beta hCG blood, ED  . POC SARS Coronavirus 2 Ag-ED - Nasal Swab (BD Veritor Kit)  . Saline lock IV  .  Discharge patient   Meds ordered this encounter  Medications  . sodium chloride 0.9 % bolus 1,000 mL  . ondansetron (ZOFRAN) injection 4 mg  . DISCONTD: HYDROmorphone (DILAUDID) injection 1 mg  . acetaminophen (TYLENOL) tablet 650 mg  . promethazine (PHENERGAN) 25 MG tablet    Sig: Take 1 tablet (25 mg total) by mouth every 6 (six) hours as needed for nausea or vomiting.    Dispense:  30 tablet    Refill:  0    Order Specific Question:   Supervising Provider    Answer:   Cherre Blanc [[id number]]    MDM HCG pending GC/CT & wet prep collected  Patient given IV fluids & zofran in ED - reports improvement in nausea Will d/c with prescription phenergan  Ultrasound shows live IUP measuring [redacted]w[redacted]d EDD updated  ASSESSMENT 1. Vomiting affecting pregnancy   2. Abdominal pain during pregnancy in first trimester   3. Normal IUP (intrauterine pregnancy) on prenatal ultrasound, first trimester     PLAN Discharge home in stable condition. Discussed reasons to return to MAU GC/CT pending Rx phenergan Start prenatal care   [place]up [place] Schedule an appointment as soon as possible for a visit.   Specialty: Obstetrics and Gynecology Contact information: [address] [phone]        Allergies as of 03/10/2020   No Known Allergies     Medication List    STOP taking these medications   ibuprofen 600 MG tablet Commonly known as: ADVIL   metroNIDAZOLE 500 MG tablet Commonly known as: FLAGYL   norethindrone 0.35 MG tablet Commonly known as: MICRONOR   norgestimate-ethinyl estradiol 0.25-35 MG-MCG tablet Commonly known as: ORTHO-CYCLEN     TAKE these medications   PrePLUS 27-1 MG Tabs Take 1 tablet by mouth daily.   promethazine 25 MG tablet Commonly known as: PHENERGAN Take 1 tablet (25 mg total) by mouth every 6 (six) hours as needed for nausea or  vomiting.        LJorje Guild NP 03/10/2020  11:04 AM

## 2020-03-11 LAB — GC/CHLAMYDIA PROBE AMP (~~LOC~~) NOT AT ARMC
Chlamydia: NEGATIVE
Comment: NEGATIVE
Comment: NORMAL
Neisseria Gonorrhea: NEGATIVE

## 2020-04-12 ENCOUNTER — Ambulatory Visit (INDEPENDENT_AMBULATORY_CARE_PROVIDER_SITE_OTHER): Payer: Medicaid Other

## 2020-04-12 DIAGNOSIS — O0990 Supervision of high risk pregnancy, unspecified, unspecified trimester: Secondary | ICD-10-CM

## 2020-04-12 HISTORY — DX: Supervision of high risk pregnancy, unspecified, unspecified trimester: O09.90

## 2020-04-12 MED ORDER — BLOOD PRESSURE KIT DEVI: 1.0000 | 0 refills | Status: DC

## 2020-04-12 NOTE — Progress Notes (Signed)
I connected with  Faith Woods on 04/12/20 by a video enabled telemedicine application and verified that I am speaking with the correct person using two identifiers.   I discussed the limitations of evaluation and management by telemedicine. The patient expressed understanding and agreed to proceed.  PRENATAL INTAKE SUMMARY  [name] presents today New OB Nurse Interview.   OB History    Gravida  4   Para  3   Term  2   Preterm  1   AB      Living  3     SAB      TAB      Ectopic      Multiple  0   Live Births  3          I have reviewed the patient's medical, obstetrical, social, and family histories, medications, and available lab results.  SUBJECTIVE She has no unusual complaints and complains of backache and right side pain 5/10 x 3 weeks  OBJECTIVE Initial Nurse Interview (New OB)  GENERAL APPEARANCE: alert, well appearing, oriented to person, place and time   ASSESSMENT Normal pregnancy PHQ-9=2   PLAN Prenatal care @ [facility] BP Cuff ordered, patient to pick up Pregnancy Risk Screening done New OB labs will be done at NOB visit

## 2020-04-14 NOTE — Progress Notes (Signed)
Patient seen and assessed by nursing staff during this encounter. I have reviewed the chart and agree with the documentation and plan. I have also made any necessary editorial changes.  Kynlei Piontek DNP, CNM  04/14/20  12:43 PM   

## 2020-04-20 ENCOUNTER — Ambulatory Visit (INDEPENDENT_AMBULATORY_CARE_PROVIDER_SITE_OTHER): Payer: Medicaid Other | Admitting: Advanced Practice Midwife

## 2020-04-20 ENCOUNTER — Other Ambulatory Visit: Payer: Self-pay

## 2020-04-20 ENCOUNTER — Other Ambulatory Visit (HOSPITAL_COMMUNITY)
Admission: RE | Admit: 2020-04-20 | Discharge: 2020-04-20 | Disposition: A | Discharge status: Home / Self Care | Payer: Medicaid Other | Source: Ambulatory Visit | Attending: Advanced Practice Midwife | Admitting: Advanced Practice Midwife

## 2020-04-20 ENCOUNTER — Encounter: Payer: Self-pay | Admitting: Advanced Practice Midwife

## 2020-04-20 DIAGNOSIS — O0990 Supervision of high risk pregnancy, unspecified, unspecified trimester: Secondary | ICD-10-CM | POA: Diagnosis not present

## 2020-04-20 DIAGNOSIS — O0991 Supervision of high risk pregnancy, unspecified, first trimester: Secondary | ICD-10-CM

## 2020-04-20 DIAGNOSIS — Z3481 Encounter for supervision of other normal pregnancy, first trimester: Secondary | ICD-10-CM | POA: Diagnosis not present

## 2020-04-20 DIAGNOSIS — Z3A11 11 weeks gestation of pregnancy: Secondary | ICD-10-CM

## 2020-04-20 MED ORDER — PRENATAL VITAMIN 27-0.8 MG PO TABS: 1.0000 | ORAL_TABLET | Freq: Every day | ORAL | 12 refills | Status: DC

## 2020-04-20 MED ORDER — ONDANSETRON 4 MG PO TBDP: 4.0000 mg | ORAL_TABLET | Freq: Three times a day (TID) | ORAL | 0 refills | Status: DC | PRN

## 2020-04-20 MED ORDER — ACETAMINOPHEN 500 MG PO TABS: 1000.0000 mg | ORAL_TABLET | Freq: Three times a day (TID) | ORAL | 3 refills | Status: DC | PRN

## 2020-04-20 NOTE — Progress Notes (Signed)
NOB in office, intake completed on 04-12-20. Pt complains of left sided pain. Pt has not picked up BP cuff yet.

## 2020-04-20 NOTE — Progress Notes (Signed)
Subjective:   Faith Woods is a 24 y.o. [id number] at 32w6dby early ultrasound being seen today for her first obstetrical visit.  Her obstetrical history is significant for history of preterm birth . Patient does intend to breast feed. Pregnancy history fully reviewed.  Patient reports no complaints.  HISTORY: OB History  Gravida Para Term Preterm AB Living  '4 3 2 1 ' 0 3  SAB TAB Ectopic Multiple Live Births  0 0 0 0 3    # Outcome Date GA Lbr Len/2nd Weight Sex Delivery Anes PTL Lv  4 Current           3 Term 04/18/19 443w0d0:50 / 00:23 8 lb 1.3 oz (3.666 kg) M Vag-Spont EPI  LIV     Birth Comments: none     Name: Woods,BOY Anaisa     Apgar1: 9  Apgar5: 9  2 Term 07/15/16 3951w5d00:11 7 lb 9.3 oz (3.44 kg) F Vag-Spont None  LIV     Birth Comments: WNL      Name: ACHEAMPONG,GIRL Niajah     Apgar1: 8  Apgar5: 9  1 Preterm 11/16/12 30w70w0dlb 3.2 oz (0.544 kg) M Vag-Spont None Y LIV    Last pap smear was done 09/23/2018 and was normal  Past Medical History:  Diagnosis Date  . Ischemic bowel syndrome (HCC)[place]. Medical history non-contributory    Past Surgical History:  Procedure Laterality Date  . NO PAST SURGERIES     Family History  Problem Relation Age of Onset  . Healthy Mother   . Healthy Father    Social History   Tobacco Use  . Smoking status: Never Smoker  . Smokeless tobacco: Never Used  Substance Use Topics  . Alcohol use: No  . Drug use: No   No Known Allergies Current Outpatient Medications on File Prior to Visit  Medication Sig Dispense Refill  . promethazine (PHENERGAN) 25 MG tablet Take 1 tablet (25 mg total) by mouth every 6 (six) hours as needed for nausea or vomiting. 30 tablet 0  . Blood Pressure Monitoring (BLOOD PRESSURE KIT) DEVI 1 kit by Does not apply route once a week. Check Blood Pressure regularly and record readings into the Babyscripts App.  Large Cuff.  DX O90.0 (Patient not taking: Reported on 04/20/2020) 1 each 0  .  Prenatal Vit-Fe Fumarate-FA (PREPLUS) 27-1 MG TABS Take 1 tablet by mouth daily. (Patient not taking: Reported on 04/20/2020) 30 tablet 13   No current facility-administered medications on file prior to visit.    Review of Systems Pertinent items noted in HPI and remainder of comprehensive ROS otherwise negative.  Exam   Vitals:   04/20/20 1407  BP: 105/68  Pulse: 67  Weight: 221 lb 14.4 oz (100.7 kg)   Fetal Heart Rate (bpm): 165  Physical Exam  Constitutional: She is oriented to person, place, and time and well-developed, well-nourished, and in no distress. No distress.  HENT:  Head: Normocephalic.  Cardiovascular: Normal rate.  Pulmonary/Chest: Effort normal.  Abdominal: Soft. There is no abdominal tenderness. There is no rebound.  Genitourinary:    Genitourinary Comments:  External: no lesion Vagina: small amount of white discharge Cervix: pink, smooth, no CMT Uterus: approx 11 weeks size  Adnexa: NT    Neurological: She is alert and oriented to person, place, and time.  Skin: Skin is warm and dry.  Psychiatric: Affect normal.  Nursing note and vitals reviewed.   Assessment:   Pregnancy:  [id number] Patient Active Problem List   Diagnosis Date Noted  . Supervision of high risk pregnancy, antepartum 04/12/2020  . Post-dates pregnancy 04/18/2019  . Umbilical cord cyst during pregnancy, antepartum 01/21/2019  . History of preterm delivery, currently pregnant 11/18/2018  . Supervision of other normal pregnancy, antepartum 09/23/2018  . Nausea/vomiting in pregnancy 09/08/2018     Plan:  1. Supervision of high risk pregnancy, antepartum - Cervicovaginal ancillary only( [place]) - Culture, OB Urine - Genetic Screening - Hepatitis C Antibody - Babyscripts Schedule Optimization   Initial labs drawn. Continue prenatal vitamins. Genetic Screening discussed, AFP and NIPS: requested. Ultrasound discussed; fetal anatomic survey: requested. Problem list reviewed  and updated. The nature of [place] with multiple MDs and other Advanced Practice Providers was explained to patient; also emphasized that residents, students are part of our team. Routine obstetric precautions reviewed. 50% of 45 min visit spent in counseling and coordination of care. Return in about 4 weeks (around 05/18/2020) for virtual visit .   Marcille Buffy DNP, CNM  04/20/20  2:24 PM

## 2020-04-20 NOTE — Patient Instructions (Signed)

## 2020-04-21 LAB — CERVICOVAGINAL ANCILLARY ONLY
Bacterial Vaginitis (gardnerella): NEGATIVE
Candida Glabrata: NEGATIVE
Candida Vaginitis: NEGATIVE
Chlamydia: NEGATIVE
Comment: NEGATIVE
Comment: NEGATIVE
Comment: NEGATIVE
Comment: NEGATIVE
Comment: NEGATIVE
Comment: NORMAL
Neisseria Gonorrhea: NEGATIVE
Trichomonas: NEGATIVE

## 2020-04-21 LAB — HEPATITIS C ANTIBODY: Hep C Virus Ab: <0.1 s/co ratio (ref 0.0–0.9)

## 2020-04-24 LAB — URINE CULTURE, OB REFLEX

## 2020-04-24 LAB — CULTURE, OB URINE

## 2020-04-25 ENCOUNTER — Encounter: Payer: Self-pay | Admitting: Obstetrics and Gynecology

## 2020-04-29 ENCOUNTER — Encounter: Payer: Self-pay | Admitting: Obstetrics and Gynecology

## 2020-05-18 ENCOUNTER — Telehealth (INDEPENDENT_AMBULATORY_CARE_PROVIDER_SITE_OTHER): Payer: Medicaid Other | Admitting: Nurse Practitioner

## 2020-05-18 ENCOUNTER — Encounter: Payer: Self-pay | Admitting: Nurse Practitioner

## 2020-05-18 DIAGNOSIS — O0992 Supervision of high risk pregnancy, unspecified, second trimester: Secondary | ICD-10-CM

## 2020-05-18 DIAGNOSIS — Z148 Genetic carrier of other disease: Secondary | ICD-10-CM

## 2020-05-18 DIAGNOSIS — O26892 Other specified pregnancy related conditions, second trimester: Secondary | ICD-10-CM

## 2020-05-18 DIAGNOSIS — O0990 Supervision of high risk pregnancy, unspecified, unspecified trimester: Secondary | ICD-10-CM

## 2020-05-18 DIAGNOSIS — M549 Dorsalgia, unspecified: Secondary | ICD-10-CM

## 2020-05-18 DIAGNOSIS — R519 Headache, unspecified: Secondary | ICD-10-CM

## 2020-05-18 HISTORY — DX: Genetic carrier of other disease: Z14.8

## 2020-05-18 NOTE — Progress Notes (Signed)
    Subjective:  Faith Woods is a 24 y.o. [id number] at [redacted]w[redacted]d being seen today for ongoing prenatal care.  She is currently monitored for the following issues for this low-risk pregnancy and has Nausea/vomiting in pregnancy; History of preterm delivery, currently pregnant; Supervision of high risk pregnancy, antepartum; and Genetic carrier on their problem list.  Patient reports backache and headache.  Contractions: Not present. Vag. Bleeding: None.  Movement: Present. Denies leaking of fluid.   The following portions of the patient's history were reviewed and updated as appropriate: allergies, current medications, past family history, past medical history, past social history, past surgical history and problem list. Problem list updated.  Objective:  There were no vitals filed for this visit.  Fetal Status:     Movement: Present     General:  Alert, oriented and cooperative. Patient is in no acute distress.  Skin: Skin is warm and dry. No rash noted.   Cardiovascular: Normal heart rate noted  Respiratory: Normal respiratory effort, no problems with respiration noted  Abdomen: Soft, gravid, appropriate for gestational age. Pain/Pressure: Present     Pelvic:  Cervical exam deferred        Extremities: Normal range of motion.  Edema: None  Mental Status: Normal mood and affect. Normal behavior. Normal judgment and thought content.   Urinalysis:      Assessment and Plan:  Pregnancy: [id number] at [redacted]w[redacted]d  1. Supervision of high risk pregnancy, antepartum [place] ordered Taking BP weekly at home Unable to log onto babyscripts - will notify staff - had with last child and may need to be manually added into babyscripts  - [place] MFM OB COMP + 14 WK; Future  2. Frequent headaches Taking tylenol - one tablet and it does not relieve the headache.  Advised to use directions on the bottle - can take 2 tablets and 6 hours later take 2 more if headache is still present. Drink at least 8 8-oz glasses  of water every day.  3. Back pain in pregnancy Low back pain - advised to not pick up her one year old child Advised tylenol may be able to help Advised to notify the office if pain is worsening, may need to evaluate for preterm contractions, get a maternity support belt or refer for PT  4.  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  Was surprised to get this info after already having 3 children.  Is planning to complete the partner's saliva test that [facility] is to be sending her. Advised not to worry at this time.  Preterm labor symptoms and general obstetric precautions including but not limited to vaginal bleeding, contractions, leaking of fluid and fetal movement were reviewed in detail with the patient. Please refer to After Visit Summary for other counseling recommendations.  Return in about 4 weeks (around 06/15/2020) for in person ROB - having some problems.  Nolene Bernheim, RN, MSN, NP-BC Nurse Practitioner, [facility] for [employer], [facility] 05/18/2020 1:43 PM

## 2020-05-18 NOTE — Progress Notes (Signed)
Virtual ROB   CC: Back pain

## 2020-06-09 ENCOUNTER — Ambulatory Visit: Payer: Medicaid Other | Attending: Nurse Practitioner

## 2020-06-09 ENCOUNTER — Other Ambulatory Visit: Payer: Self-pay

## 2020-06-09 DIAGNOSIS — O99212 Obesity complicating pregnancy, second trimester: Secondary | ICD-10-CM

## 2020-06-09 DIAGNOSIS — Z148 Genetic carrier of other disease: Secondary | ICD-10-CM

## 2020-06-09 DIAGNOSIS — O09212 Supervision of pregnancy with history of pre-term labor, second trimester: Secondary | ICD-10-CM

## 2020-06-09 DIAGNOSIS — Z363 Encounter for antenatal screening for malformations: Secondary | ICD-10-CM

## 2020-06-09 DIAGNOSIS — Z3A19 19 weeks gestation of pregnancy: Secondary | ICD-10-CM

## 2020-06-09 DIAGNOSIS — O0990 Supervision of high risk pregnancy, unspecified, unspecified trimester: Secondary | ICD-10-CM | POA: Diagnosis present

## 2020-06-09 DIAGNOSIS — E669 Obesity, unspecified: Secondary | ICD-10-CM

## 2020-06-10 ENCOUNTER — Other Ambulatory Visit: Payer: Self-pay | Admitting: *Deleted

## 2020-06-10 DIAGNOSIS — Z362 Encounter for other antenatal screening follow-up: Secondary | ICD-10-CM

## 2020-06-15 ENCOUNTER — Encounter: Payer: Medicaid Other | Admitting: Obstetrics and Gynecology

## 2020-06-24 ENCOUNTER — Ambulatory Visit (INDEPENDENT_AMBULATORY_CARE_PROVIDER_SITE_OTHER): Payer: Medicaid Other | Admitting: Obstetrics and Gynecology

## 2020-06-24 ENCOUNTER — Encounter: Payer: Self-pay | Admitting: Obstetrics and Gynecology

## 2020-06-24 ENCOUNTER — Other Ambulatory Visit: Payer: Self-pay

## 2020-06-24 VITALS — BP 101/67 | HR 91 | Wt 229.1 lb

## 2020-06-24 DIAGNOSIS — Z3A21 21 weeks gestation of pregnancy: Secondary | ICD-10-CM

## 2020-06-24 DIAGNOSIS — O09899 Supervision of other high risk pregnancies, unspecified trimester: Secondary | ICD-10-CM

## 2020-06-24 DIAGNOSIS — O09212 Supervision of pregnancy with history of pre-term labor, second trimester: Secondary | ICD-10-CM

## 2020-06-24 DIAGNOSIS — O0990 Supervision of high risk pregnancy, unspecified, unspecified trimester: Secondary | ICD-10-CM

## 2020-06-24 MED ORDER — VITAFOL ULTRA 29-0.6-0.4-200 MG PO CAPS: 1.0000 | ORAL_CAPSULE | Freq: Every day | ORAL | 12 refills | Status: DC

## 2020-06-24 MED ORDER — COMFORT FIT MATERNITY SUPP LG MISC: 0 refills | Status: DC

## 2020-06-24 NOTE — Progress Notes (Signed)
   PRENATAL VISIT NOTE  Subjective:  Faith Woods is a 24 y.o. L3Y1017 at [redacted]w[redacted]d being seen today for ongoing prenatal care.  She is currently monitored for the following issues for this high-risk pregnancy and has Nausea/vomiting in pregnancy; History of preterm delivery, currently pregnant; Supervision of high risk pregnancy, antepartum; and Genetic carrier on their problem list.  Patient reports backache.  Contractions: Not present. Vag. Bleeding: None.  Movement: Present. Denies leaking of fluid.   The following portions of the patient's history were reviewed and updated as appropriate: allergies, current medications, past family history, past medical history, past social history, past surgical history and problem list.   Objective:   Vitals:   06/24/20 1039  BP: 101/67  Pulse: 91  Weight: 229 lb 1.6 oz (103.9 kg)    Fetal Status: Fetal Heart Rate (bpm): 156 Fundal Height: 21 cm Movement: Present     General:  Alert, oriented and cooperative. Patient is in no acute distress.  Skin: Skin is warm and dry. No rash noted.   Cardiovascular: Normal heart rate noted  Respiratory: Normal respiratory effort, no problems with respiration noted  Abdomen: Soft, gravid, appropriate for gestational age.  Pain/Pressure: Present     Pelvic: Cervical exam deferred        Extremities: Normal range of motion.  Edema: Trace  Mental Status: Normal mood and affect. Normal behavior. Normal judgment and thought content.   Assessment and Plan:  Pregnancy: P1W2585 at [redacted]w[redacted]d 1. Supervision of high risk pregnancy, antepartum Patient is doing well  rx maternity support belt provided Patient referred to dentistry for tooth pain Follow up anatomy ultrasound - Ambulatory referral to Dentistry  2. History of preterm delivery, currently pregnant Declined 17-P  Preterm labor symptoms and general obstetric precautions including but not limited to vaginal bleeding, contractions, leaking of fluid and  fetal movement were reviewed in detail with the patient. Please refer to After Visit Summary for other counseling recommendations.   Return in about 4 weeks (around 07/22/2020) for in person, ROB, High risk.  Future Appointments  Date Time Provider Department Center  07/07/2020  1:00 PM [facility] NURSE [facility]  07/07/2020  1:15 PM [facility]  07/22/2020 10:30 AM Conan Bowens, MD [facility] None    Catalina Antigua, MD

## 2020-07-07 ENCOUNTER — Ambulatory Visit: Payer: Medicaid Other | Attending: Obstetrics and Gynecology

## 2020-07-07 ENCOUNTER — Other Ambulatory Visit: Payer: Self-pay

## 2020-07-07 ENCOUNTER — Ambulatory Visit: Payer: Medicaid Other | Admitting: *Deleted

## 2020-07-07 DIAGNOSIS — Z3A23 23 weeks gestation of pregnancy: Secondary | ICD-10-CM

## 2020-07-07 DIAGNOSIS — O0990 Supervision of high risk pregnancy, unspecified, unspecified trimester: Secondary | ICD-10-CM | POA: Diagnosis present

## 2020-07-07 DIAGNOSIS — O99212 Obesity complicating pregnancy, second trimester: Secondary | ICD-10-CM

## 2020-07-07 DIAGNOSIS — O09212 Supervision of pregnancy with history of pre-term labor, second trimester: Secondary | ICD-10-CM

## 2020-07-07 DIAGNOSIS — E669 Obesity, unspecified: Secondary | ICD-10-CM

## 2020-07-07 DIAGNOSIS — Z363 Encounter for antenatal screening for malformations: Secondary | ICD-10-CM

## 2020-07-07 DIAGNOSIS — Z362 Encounter for other antenatal screening follow-up: Secondary | ICD-10-CM | POA: Insufficient documentation

## 2020-07-07 DIAGNOSIS — Z148 Genetic carrier of other disease: Secondary | ICD-10-CM

## 2020-07-22 ENCOUNTER — Telehealth (INDEPENDENT_AMBULATORY_CARE_PROVIDER_SITE_OTHER): Payer: Medicaid Other | Admitting: Obstetrics and Gynecology

## 2020-07-22 ENCOUNTER — Encounter: Payer: Self-pay | Admitting: Obstetrics and Gynecology

## 2020-07-22 DIAGNOSIS — B951 Streptococcus, group B, as the cause of diseases classified elsewhere: Secondary | ICD-10-CM

## 2020-07-22 DIAGNOSIS — O0992 Supervision of high risk pregnancy, unspecified, second trimester: Secondary | ICD-10-CM

## 2020-07-22 DIAGNOSIS — Z8759 Personal history of other complications of pregnancy, childbirth and the puerperium: Secondary | ICD-10-CM

## 2020-07-22 DIAGNOSIS — Z7189 Other specified counseling: Secondary | ICD-10-CM

## 2020-07-22 DIAGNOSIS — R8271 Bacteriuria: Secondary | ICD-10-CM | POA: Insufficient documentation

## 2020-07-22 DIAGNOSIS — O09299 Supervision of pregnancy with other poor reproductive or obstetric history, unspecified trimester: Secondary | ICD-10-CM

## 2020-07-22 DIAGNOSIS — O0990 Supervision of high risk pregnancy, unspecified, unspecified trimester: Secondary | ICD-10-CM

## 2020-07-22 DIAGNOSIS — Z3009 Encounter for other general counseling and advice on contraception: Secondary | ICD-10-CM

## 2020-07-22 DIAGNOSIS — Z3A25 25 weeks gestation of pregnancy: Secondary | ICD-10-CM

## 2020-07-22 DIAGNOSIS — O09292 Supervision of pregnancy with other poor reproductive or obstetric history, second trimester: Secondary | ICD-10-CM

## 2020-07-22 DIAGNOSIS — O99820 Streptococcus B carrier state complicating pregnancy: Secondary | ICD-10-CM

## 2020-07-22 DIAGNOSIS — O09899 Supervision of other high risk pregnancies, unspecified trimester: Secondary | ICD-10-CM

## 2020-07-22 HISTORY — DX: Supervision of pregnancy with other poor reproductive or obstetric history, unspecified trimester: O09.299

## 2020-07-22 NOTE — Progress Notes (Signed)
I connected with  Faith Woods on 07/22/20 by a video enabled telemedicine application and verified that I am speaking with the correct person using two identifiers.   I discussed the limitations of evaluation and management by telemedicine. The patient expressed understanding and agreed to proceed.  MyChart ROB reports no problems today. She is unable to check her BP because she does not have it with her at the moment.
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
## 2020-08-08 ENCOUNTER — Other Ambulatory Visit: Payer: Self-pay

## 2020-08-08 ENCOUNTER — Encounter: Payer: Self-pay | Admitting: Obstetrics and Gynecology

## 2020-08-08 ENCOUNTER — Ambulatory Visit (INDEPENDENT_AMBULATORY_CARE_PROVIDER_SITE_OTHER): Payer: Medicaid Other | Admitting: Obstetrics and Gynecology

## 2020-08-08 ENCOUNTER — Other Ambulatory Visit: Payer: Medicaid Other

## 2020-08-08 VITALS — BP 113/71 | HR 103 | Wt 233.0 lb

## 2020-08-08 DIAGNOSIS — O09899 Supervision of other high risk pregnancies, unspecified trimester: Secondary | ICD-10-CM

## 2020-08-08 DIAGNOSIS — R8271 Bacteriuria: Secondary | ICD-10-CM

## 2020-08-08 DIAGNOSIS — O0990 Supervision of high risk pregnancy, unspecified, unspecified trimester: Secondary | ICD-10-CM

## 2020-08-08 NOTE — Progress Notes (Signed)
Pt presents for ROB and 2 gtt labs Tdap and Flu vaccine offered; pt declined today.

## 2020-08-08 NOTE — Progress Notes (Signed)
   PRENATAL VISIT NOTE  Subjective:  Faith Woods is a 24 y.o. [phone] at [redacted]w[redacted]d being seen today for ongoing prenatal care.  She is currently monitored for the following issues for this high-risk pregnancy and has Nausea/vomiting in pregnancy; History of preterm delivery, currently pregnant; Supervision of high risk pregnancy, antepartum; Genetic carrier; GBS bacteriuria; and H/O postpartum hemorrhage, currently pregnant on their problem list.  Patient reports no complaints.  Contractions: Not present. Vag. Bleeding: None.  Movement: Present. Denies leaking of fluid.   The following portions of the patient's history were reviewed and updated as appropriate: allergies, current medications, past family history, past medical history, past social history, past surgical history and problem list.   Objective:   Vitals:   08/08/20 0946  BP: 113/71  Pulse: (!) 103  Weight: 233 lb (105.7 kg)    Fetal Status: Fetal Heart Rate (bpm): 152 Fundal Height: 28 cm Movement: Present     General:  Alert, oriented and cooperative. Patient is in no acute distress.  Skin: Skin is warm and dry. No rash noted.   Cardiovascular: Normal heart rate noted  Respiratory: Normal respiratory effort, no problems with respiration noted  Abdomen: Soft, gravid, appropriate for gestational age.  Pain/Pressure: Absent     Pelvic: Cervical exam deferred        Extremities: Normal range of motion.  Edema: None  Mental Status: Normal mood and affect. Normal behavior. Normal judgment and thought content.   Assessment and Plan:  Pregnancy: W7P7106 at [redacted]w[redacted]d 1. Supervision of high risk pregnancy, antepartum Patient is doing well without complaints Third trimester labs today with glucola Patient declined tdap Patient desires BTL- form signed today  2. History of preterm delivery, currently pregnant Previously declined 17-p  3. GBS bacteriuria Prophylaxis in labor  Preterm labor symptoms and general obstetric  precautions including but not limited to vaginal bleeding, contractions, leaking of fluid and fetal movement were reviewed in detail with the patient. Please refer to After Visit Summary for other counseling recommendations.   Return in about 2 weeks (around 08/22/2020) for in person, ROB, Low risk.  No future appointments.  Catalina Antigua, MD

## 2020-08-09 LAB — CBC/D/PLT+RPR+RH+ABO+RUB AB...
Antibody Screen: NEGATIVE
Basophils Absolute: 0.1 10*3/uL (ref 0.0–0.2)
Basos: 1 %
EOS (ABSOLUTE): 0.1 10*3/uL (ref 0.0–0.4)
Eos: 1 %
HCV Ab: <0.1 s/co ratio (ref 0.0–0.9)
HIV Screen 4th Generation wRfx: NONREACTIVE
Hematocrit: 36.6 % (ref 34.0–46.6)
Hemoglobin: 11.6 g/dL (ref 11.1–15.9)
Hepatitis B Surface Ag: NEGATIVE
Immature Grans (Abs): 0.1 10*3/uL (ref 0.0–0.1)
Immature Granulocytes: 1 %
Lymphocytes Absolute: 2 10*3/uL (ref 0.7–3.1)
Lymphs: 18 %
MCH: 29.1 pg (ref 26.6–33.0)
MCHC: 31.7 g/dL (ref 31.5–35.7)
MCV: 92 fL (ref 79–97)
Monocytes Absolute: 1 10*3/uL — ABNORMAL HIGH (ref 0.1–0.9)
Monocytes: 10 %
Neutrophils Absolute: 7.7 10*3/uL — ABNORMAL HIGH (ref 1.4–7.0)
Neutrophils: 69 %
Platelets: 203 10*3/uL (ref 150–450)
RBC: 3.98 x10E6/uL (ref 3.77–5.28)
RDW: 13.2 % (ref 11.7–15.4)
RPR Ser Ql: NONREACTIVE
Rh Factor: POSITIVE
Rubella Antibodies, IGG: 2.02 index (ref >0.99)
WBC: 10.9 10*3/uL — ABNORMAL HIGH (ref 3.4–10.8)

## 2020-08-09 LAB — HCV INTERPRETATION

## 2020-08-09 LAB — GLUCOSE TOLERANCE, 2 HOURS W/ 1HR
Glucose, 1 hour: 123 mg/dL (ref 65–179)
Glucose, 2 hour: 87 mg/dL (ref 65–152)
Glucose, Fasting: 84 mg/dL (ref 65–91)

## 2020-08-22 ENCOUNTER — Other Ambulatory Visit: Payer: Self-pay

## 2020-08-22 ENCOUNTER — Ambulatory Visit (INDEPENDENT_AMBULATORY_CARE_PROVIDER_SITE_OTHER): Payer: Medicaid Other | Admitting: Nurse Practitioner

## 2020-08-22 DIAGNOSIS — O0990 Supervision of high risk pregnancy, unspecified, unspecified trimester: Secondary | ICD-10-CM

## 2020-08-22 NOTE — Progress Notes (Signed)
Patient reports fetal movement, denies pain. 

## 2020-08-22 NOTE — Progress Notes (Signed)
    Subjective:  Faith Woods is a 24 y.o. [phone] at [redacted]w[redacted]d being seen today for ongoing prenatal care.  She is currently monitored for the following issues for this high-risk pregnancy and has Nausea/vomiting in pregnancy; History of preterm delivery, currently pregnant; Supervision of high risk pregnancy, antepartum; Genetic carrier; GBS bacteriuria; and H/O postpartum hemorrhage, currently pregnant on their problem list.  Patient reports no complaints.  Contractions: Not present. Vag. Bleeding: None.  Movement: Present. Denies leaking of fluid.   The following portions of the patient's history were reviewed and updated as appropriate: allergies, current medications, past family history, past medical history, past social history, past surgical history and problem list. Problem list updated.  Objective:   Vitals:   08/22/20 0902  BP: 127/78  Pulse: 97  Weight: 233 lb 9.6 oz (106 kg)    Fetal Status: Fetal Heart Rate (bpm): 141 Fundal Height: 31 cm Movement: Present     General:  Alert, oriented and cooperative. Patient is in no acute distress.  Skin: Skin is warm and dry. No rash noted.   Cardiovascular: Normal heart rate noted  Respiratory: Normal respiratory effort, no problems with respiration noted  Abdomen: Soft, gravid, appropriate for gestational age. Pain/Pressure: Absent     Pelvic:  Cervical exam deferred        Extremities: Normal range of motion.  Edema: Trace  Mental Status: Normal mood and affect. Normal behavior. Normal judgment and thought content.   Urinalysis:      Assessment and Plan:  Pregnancy: [id number] at [redacted]w[redacted]d  1. Supervision of high risk pregnancy, antepartum Doing well Dose not have babyscripts Declined flu shot  Preterm labor symptoms and general obstetric precautions including but not limited to vaginal bleeding, contractions, leaking of fluid and fetal movement were reviewed in detail with the patient. Please refer to After Visit Summary for  other counseling recommendations.  Return in about 2 weeks (around 09/05/2020) for MD visit for ROB to discuss induction.  Has family needs and wants to be induced.  Advised it is not usually scheduled before 39 weeks but will let her discuss with MD.  Nolene Bernheim, RN, MSN, NP-BC Nurse Practitioner, [facility] for [facility], [facility] 08/22/2020 5:40 PM

## 2020-09-05 ENCOUNTER — Encounter: Payer: Self-pay | Admitting: Obstetrics and Gynecology

## 2020-09-05 ENCOUNTER — Other Ambulatory Visit: Payer: Self-pay

## 2020-09-05 ENCOUNTER — Ambulatory Visit (INDEPENDENT_AMBULATORY_CARE_PROVIDER_SITE_OTHER): Payer: Medicaid Other | Admitting: Obstetrics and Gynecology

## 2020-09-05 VITALS — BP 109/67 | HR 99 | Wt 237.9 lb

## 2020-09-05 DIAGNOSIS — O09899 Supervision of other high risk pregnancies, unspecified trimester: Secondary | ICD-10-CM

## 2020-09-05 DIAGNOSIS — R8271 Bacteriuria: Secondary | ICD-10-CM

## 2020-09-05 DIAGNOSIS — O0990 Supervision of high risk pregnancy, unspecified, unspecified trimester: Secondary | ICD-10-CM

## 2020-09-05 NOTE — Progress Notes (Signed)
Pt is here for ROB, [redacted]w[redacted]d.

## 2020-09-05 NOTE — Progress Notes (Signed)
   PRENATAL VISIT NOTE  Subjective:  Faith Woods is a 24 y.o. [id number] at [redacted]w[redacted]d being seen today for ongoing prenatal care.  She is currently monitored for the following issues for this low-risk pregnancy and has Nausea/vomiting in pregnancy; History of preterm delivery, currently pregnant; Supervision of high risk pregnancy, antepartum; Genetic carrier; GBS bacteriuria; and H/O postpartum hemorrhage, currently pregnant on their problem list.  Patient reports no complaints.  Contractions: Irritability. Vag. Bleeding: None.  Movement: Present. Denies leaking of fluid.   The following portions of the patient's history were reviewed and updated as appropriate: allergies, current medications, past family history, past medical history, past social history, past surgical history and problem list.   Objective:   Vitals:   09/05/20 0913  BP: 109/67  Pulse: 99  Weight: 237 lb 14.4 oz (107.9 kg)    Fetal Status: Fetal Heart Rate (bpm): 140 Fundal Height: 32 cm Movement: Present     General:  Alert, oriented and cooperative. Patient is in no acute distress.  Skin: Skin is warm and dry. No rash noted.   Cardiovascular: Normal heart rate noted  Respiratory: Normal respiratory effort, no problems with respiration noted  Abdomen: Soft, gravid, appropriate for gestational age.  Pain/Pressure: Absent     Pelvic: Cervical exam deferred        Extremities: Normal range of motion.  Edema: Trace  Mental Status: Normal mood and affect. Normal behavior. Normal judgment and thought content.   Assessment and Plan:  Pregnancy: [id number] at [redacted]w[redacted]d 1. Supervision of high risk pregnancy, antepartum Patient is doing well without complaints Patient desires elective IOL close to 39 weeks as she desires to plan for child care given that she doesn;t have extended family in the area  2. History of preterm delivery, currently pregnant Declined 17-P  3. GBS bacteriuria Prophylaxis in labor  Preterm labor  symptoms and general obstetric precautions including but not limited to vaginal bleeding, contractions, leaking of fluid and fetal movement were reviewed in detail with the patient. Please refer to After Visit Summary for other counseling recommendations.   Return in about 2 weeks (around 09/19/2020) for in person, ROB, Low risk.  No future appointments.  Catalina Antigua, MD

## 2020-09-20 ENCOUNTER — Ambulatory Visit (INDEPENDENT_AMBULATORY_CARE_PROVIDER_SITE_OTHER): Payer: Medicaid Other | Admitting: Women's Health

## 2020-09-20 ENCOUNTER — Other Ambulatory Visit: Payer: Self-pay

## 2020-09-20 ENCOUNTER — Encounter: Payer: Self-pay | Admitting: Women's Health

## 2020-09-20 ENCOUNTER — Encounter: Payer: Self-pay | Admitting: Obstetrics

## 2020-09-20 VITALS — BP 117/73 | HR 92 | Wt 234.2 lb

## 2020-09-20 DIAGNOSIS — O09899 Supervision of other high risk pregnancies, unspecified trimester: Secondary | ICD-10-CM

## 2020-09-20 DIAGNOSIS — Z148 Genetic carrier of other disease: Secondary | ICD-10-CM

## 2020-09-20 DIAGNOSIS — Z3A33 33 weeks gestation of pregnancy: Secondary | ICD-10-CM

## 2020-09-20 DIAGNOSIS — O0990 Supervision of high risk pregnancy, unspecified, unspecified trimester: Secondary | ICD-10-CM

## 2020-09-20 DIAGNOSIS — R8271 Bacteriuria: Secondary | ICD-10-CM

## 2020-09-20 NOTE — Patient Instructions (Signed)
Maternity Assessment Unit (MAU)  The Maternity Assessment Unit (MAU) is located at the [facility] at [facility]. The address is: [address], [place], [place], [place] [address]. Please see map below for additional directions.    The Maternity Assessment Unit is designed to help you during your pregnancy, and for up to 6 weeks after delivery, with any pregnancy- or postpartum-related emergencies, if you think you are in labor, or if your water has broken. For example, if you experience nausea and vomiting, vaginal bleeding, severe abdominal or pelvic pain, elevated blood pressure or other problems related to your pregnancy or postpartum time, please come to the Maternity Assessment Unit for assistance.        Group B Streptococcus Infection During Pregnancy Group B Streptococcus (GBS) is a type of bacteria that is often found in healthy people. It is commonly found in the rectum, vagina, and intestines. In people who are healthy and not pregnant, the bacteria rarely cause serious illness or complications. However, women who test positive for GBS during pregnancy can pass the bacteria to the baby during childbirth. This can cause serious infection in the baby after birth. Women with GBS may also have infections during their pregnancy or soon after childbirth. The infections include urinary tract infections (UTIs) or infections of the uterus. GBS also increases a woman's risk of complications during pregnancy, such as early labor or delivery, miscarriage, or stillbirth. Routine testing for GBS is recommended for all pregnant women. What are the causes? This condition is caused by bacteria called Streptococcus agalactiae. What increases the risk? You may have a higher risk for GBS infection during pregnancy if you had one during a past pregnancy. What are the signs or symptoms? In most cases, GBS infection does not cause symptoms in pregnant women. If symptoms  exist, they may include:  Labor that starts before the 37th week of pregnancy.  A UTI or bladder infection. This may cause a fever, frequent urination, or pain and burning during urination.  Fever during labor. There can also be a rapid heartbeat in the mother or baby. Rare but serious symptoms of a GBS infection in women include:  Blood infection (septicemia). This may cause fever, chills, or confusion.  Lung infection (pneumonia). This may cause fever, chills, cough, rapid breathing, chest pain, or difficulty breathing.  Bone, joint, skin, or soft tissue infection. How is this diagnosed? You may be screened for GBS between week 35 and week 37 of pregnancy. If you have symptoms of preterm labor, you may be screened earlier. This condition is diagnosed based on lab test results from:  A swab of fluid from the vagina and rectum.  A urine sample. How is this treated? This condition is treated with antibiotic medicine. Antibiotic medicine may be given:  To you when you go into labor, or as soon as your water breaks. The medicines will continue until after you give birth. If you are having a cesarean delivery, you do not need antibiotics unless your water has broken.  To your baby, if he or she requires treatment. Your health care provider will check your baby to decide if he or she needs antibiotics to prevent a serious infection. Follow these instructions at home:  Take over-the-counter and prescription medicines only as told by your health care provider.  Take your antibiotic medicine as told by your health care provider. Do not stop taking the antibiotic even if you start to feel better.  Keep all  pre-birth (prenatal) visits and follow-up visits as told by your health care provider. This is important. Contact a health care provider if:  You have pain or burning when you urinate.  You have to urinate more often than usual.  You have a fever or chills.  You develop a  bad-smelling vaginal discharge. Get help right away if:  Your water breaks.  You go into labor.  You have severe pain in your abdomen.  You have difficulty breathing.  You have chest pain. These symptoms may represent a serious problem that is an emergency. Do not wait to see if the symptoms will go away. Get medical help right away. Call your local emergency services (911 in the [place] Do not drive yourself to the hospital. Summary  GBS is a type of bacteria that is common in healthy people.  During pregnancy, colonization with GBS can cause serious complications for you or your baby.  Your health care provider will screen you between 35 and 37 weeks of pregnancy to determine if you are colonized with GBS.  If you are colonized with GBS during pregnancy, your health care provider will recommend antibiotics through an IV during labor.  After delivery, your baby will be evaluated for complications related to potential GBS infection and may require antibiotics to prevent a serious infection. This information is not intended to replace advice given to you by your health care provider. Make sure you discuss any questions you have with your health care provider. Document Revised: 06/22/2019 Document Reviewed: 06/22/2019 Elsevier Patient Education  2020 [employer].        Preterm Labor and Birth Information  The normal length of a pregnancy is 39-41 weeks. Preterm labor is when labor starts before 37 completed weeks of pregnancy. What are the risk factors for preterm labor? Preterm labor is more likely to occur in women who:  Have certain infections during pregnancy such as a bladder infection, sexually transmitted infection, or infection inside the uterus (chorioamnionitis).  Have a shorter-than-normal cervix.  Have gone into preterm labor before.  Have had surgery on their cervix.  Are younger than age 18 or older than age 22.  Are African American.  Are pregnant  with twins or multiple babies (multiple gestation).  Take street drugs or smoke while pregnant.  Do not gain enough weight while pregnant.  Became pregnant shortly after having been pregnant. What are the symptoms of preterm labor? Symptoms of preterm labor include:  Cramps similar to those that can happen during a menstrual period. The cramps may happen with diarrhea.  Pain in the abdomen or lower back.  Regular uterine contractions that may feel like tightening of the abdomen.  A feeling of increased pressure in the pelvis.  Increased watery or bloody mucus discharge from the vagina.  Water breaking (ruptured amniotic sac). Why is it important to recognize signs of preterm labor? It is important to recognize signs of preterm labor because babies who are born prematurely may not be fully developed. This can put them at an increased risk for:  Long-term (chronic) heart and lung problems.  Difficulty immediately after birth with regulating body systems, including blood sugar, body temperature, heart rate, and breathing rate.  Bleeding in the brain.  Cerebral palsy.  Learning difficulties.  Death. These risks are highest for babies who are born before 34 weeks of pregnancy. How is preterm labor treated? Treatment depends on the length of your pregnancy, your condition, and the health of your baby. It may  involve:  Having a stitch (suture) placed in your cervix to prevent your cervix from opening too early (cerclage).  Taking or being given medicines, such as: ? Hormone medicines. These may be given early in pregnancy to help support the pregnancy. ? Medicine to stop contractions. ? Medicines to help mature the baby's lungs. These may be prescribed if the risk of delivery is high. ? Medicines to prevent your baby from developing cerebral palsy. If the labor happens before 34 weeks of pregnancy, you may need to stay in the hospital. What should I do if I think I am in  preterm labor? If you think that you are going into preterm labor, call your health care provider right away. How can I prevent preterm labor in future pregnancies? To increase your chance of having a full-term pregnancy:  Do not use any tobacco products, such as cigarettes, chewing tobacco, and e-cigarettes. If you need help quitting, ask your health care provider.  Do not use street drugs or medicines that have not been prescribed to you during your pregnancy.  Talk with your health care provider before taking any herbal supplements, even if you have been taking them regularly.  Make sure you gain a healthy amount of weight during your pregnancy.  Watch for infection. If you think that you might have an infection, get it checked right away.  Make sure to tell your health care provider if you have gone into preterm labor before. This information is not intended to replace advice given to you by your health care provider. Make sure you discuss any questions you have with your health care provider. Document Revised: 03/20/2019 Document Reviewed: 04/18/2016 Elsevier Patient Education  2020 [employer].
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
## 2020-09-20 NOTE — Progress Notes (Signed)
Pt presents for ROB reports no complaints today  Flu and Tdap offered, pt declined

## 2020-09-28 ENCOUNTER — Other Ambulatory Visit: Payer: Self-pay

## 2020-09-28 ENCOUNTER — Encounter (HOSPITAL_COMMUNITY): Payer: Self-pay

## 2020-09-28 ENCOUNTER — Inpatient Hospital Stay (HOSPITAL_COMMUNITY)
Admission: AD | Admit: 2020-09-28 | Discharge: 2020-09-29 | Disposition: A | Discharge status: Home / Self Care | Payer: Medicaid Other | Attending: Obstetrics & Gynecology | Admitting: Obstetrics & Gynecology

## 2020-09-28 DIAGNOSIS — R109 Unspecified abdominal pain: Secondary | ICD-10-CM | POA: Insufficient documentation

## 2020-09-28 DIAGNOSIS — O09213 Supervision of pregnancy with history of pre-term labor, third trimester: Secondary | ICD-10-CM | POA: Insufficient documentation

## 2020-09-28 DIAGNOSIS — M542 Cervicalgia: Secondary | ICD-10-CM | POA: Insufficient documentation

## 2020-09-28 DIAGNOSIS — Y92410 Unspecified street and highway as the place of occurrence of the external cause: Secondary | ICD-10-CM | POA: Insufficient documentation

## 2020-09-28 DIAGNOSIS — Z3A35 35 weeks gestation of pregnancy: Secondary | ICD-10-CM | POA: Insufficient documentation

## 2020-09-28 DIAGNOSIS — O9A213 Injury, poisoning and certain other consequences of external causes complicating pregnancy, third trimester: Secondary | ICD-10-CM | POA: Diagnosis not present

## 2020-09-28 DIAGNOSIS — O26893 Other specified pregnancy related conditions, third trimester: Secondary | ICD-10-CM | POA: Diagnosis not present

## 2020-09-28 DIAGNOSIS — R1084 Generalized abdominal pain: Secondary | ICD-10-CM | POA: Diagnosis not present

## 2020-09-28 DIAGNOSIS — M549 Dorsalgia, unspecified: Secondary | ICD-10-CM | POA: Diagnosis not present

## 2020-09-28 LAB — COMPREHENSIVE METABOLIC PANEL
ALT: 8 U/L (ref 0–44)
AST: 16 U/L (ref 15–41)
Albumin: 3.4 g/dL — ABNORMAL LOW (ref 3.5–5.0)
Alkaline Phosphatase: 73 U/L (ref 38–126)
Anion gap: 10 (ref 5–15)
BUN: 6 mg/dL (ref 6–20)
CO2: 19 mmol/L — ABNORMAL LOW (ref 22–32)
Calcium: 9.3 mg/dL (ref 8.9–10.3)
Chloride: 106 mmol/L (ref 98–111)
Creatinine, Ser: 0.58 mg/dL (ref 0.44–1.00)
GFR, Estimated: >60 mL/min (ref >60)
Glucose, Bld: 107 mg/dL — ABNORMAL HIGH (ref 70–99)
Potassium: 3.6 mmol/L (ref 3.5–5.1)
Sodium: 135 mmol/L (ref 135–145)
Total Bilirubin: 0.2 mg/dL — ABNORMAL LOW (ref 0.3–1.2)
Total Protein: 6.8 g/dL (ref 6.5–8.1)

## 2020-09-28 LAB — CBC WITH DIFFERENTIAL/PLATELET
Abs Immature Granulocytes: 0.07 10*3/uL (ref 0.00–0.07)
Basophils Absolute: 0 10*3/uL (ref 0.0–0.1)
Basophils Relative: 0 %
Eosinophils Absolute: 0.1 10*3/uL (ref 0.0–0.5)
Eosinophils Relative: 1 %
HCT: 34.1 % — ABNORMAL LOW (ref 36.0–46.0)
Hemoglobin: 11 g/dL — ABNORMAL LOW (ref 12.0–15.0)
Immature Granulocytes: 1 %
Lymphocytes Relative: 19 %
Lymphs Abs: 1.8 10*3/uL (ref 0.7–4.0)
MCH: 28.5 pg (ref 26.0–34.0)
MCHC: 32.3 g/dL (ref 30.0–36.0)
MCV: 88.3 fL (ref 80.0–100.0)
Monocytes Absolute: 1 10*3/uL (ref 0.1–1.0)
Monocytes Relative: 10 %
Neutro Abs: 6.9 10*3/uL (ref 1.7–7.7)
Neutrophils Relative %: 69 %
Platelets: 190 10*3/uL (ref 150–400)
RBC: 3.86 MIL/uL — ABNORMAL LOW (ref 3.87–5.11)
RDW: 12.6 % (ref 11.5–15.5)
WBC: 9.9 10*3/uL (ref 4.0–10.5)
nRBC: 0 % (ref 0.0–0.2)

## 2020-09-28 MED ORDER — CYCLOBENZAPRINE HCL 5 MG PO TABS
10.0000 mg | ORAL_TABLET | Freq: Once | ORAL | Status: AC
  Administered 2020-09-28: 10 mg via ORAL
  Filled 2020-09-28: qty 2

## 2020-09-28 MED ORDER — LACTATED RINGERS IV BOLUS
1000.0000 mL | Freq: Once | INTRAVENOUS | Status: AC
  Administered 2020-09-28: 1000 mL via INTRAVENOUS

## 2020-09-28 MED ORDER — OXYCODONE-ACETAMINOPHEN 5-325 MG PO TABS
1.0000 | ORAL_TABLET | Freq: Once | ORAL | Status: AC
  Administered 2020-09-29: 1 via ORAL
  Filled 2020-09-28: qty 1

## 2020-09-28 MED ORDER — ACETAMINOPHEN 500 MG PO TABS
1000.0000 mg | ORAL_TABLET | Freq: Once | ORAL | Status: AC
  Administered 2020-09-28: 1000 mg via ORAL
  Filled 2020-09-28: qty 2

## 2020-09-28 NOTE — Progress Notes (Signed)
Medical clearance received from ER MD and pt removed for monitors to be transfer to MAU.
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
## 2020-09-28 NOTE — Progress Notes (Signed)
Dr. Debroah Loop called and given update on pt, he told OB RN that once pt was cleared from a medical standpoint pt can be transferred to MAU for further monitoring.
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
## 2020-09-28 NOTE — ED Notes (Signed)
OB rapid RN at bedside placing pt on fetal monitor

## 2020-09-29 DIAGNOSIS — O26893 Other specified pregnancy related conditions, third trimester: Secondary | ICD-10-CM

## 2020-09-29 DIAGNOSIS — R1084 Generalized abdominal pain: Secondary | ICD-10-CM

## 2020-09-29 MED ORDER — CYCLOBENZAPRINE HCL 10 MG PO TABS: 10.0000 mg | ORAL_TABLET | Freq: Three times a day (TID) | ORAL | 2 refills | Status: DC | PRN
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
## 2020-10-11 ENCOUNTER — Ambulatory Visit (INDEPENDENT_AMBULATORY_CARE_PROVIDER_SITE_OTHER): Payer: Medicaid Other | Admitting: Obstetrics and Gynecology

## 2020-10-11 ENCOUNTER — Other Ambulatory Visit (HOSPITAL_COMMUNITY)
Admission: RE | Admit: 2020-10-11 | Discharge: 2020-10-11 | Disposition: A | Discharge status: Home / Self Care | Payer: Medicaid Other | Source: Ambulatory Visit | Attending: Obstetrics and Gynecology | Admitting: Obstetrics and Gynecology

## 2020-10-11 ENCOUNTER — Other Ambulatory Visit: Payer: Self-pay

## 2020-10-11 ENCOUNTER — Encounter: Payer: Self-pay | Admitting: Obstetrics and Gynecology

## 2020-10-11 VITALS — BP 107/69 | HR 93 | Wt 234.0 lb

## 2020-10-11 DIAGNOSIS — O0990 Supervision of high risk pregnancy, unspecified, unspecified trimester: Secondary | ICD-10-CM | POA: Diagnosis not present

## 2020-10-11 DIAGNOSIS — Z3A36 36 weeks gestation of pregnancy: Secondary | ICD-10-CM | POA: Insufficient documentation

## 2020-10-11 DIAGNOSIS — R8271 Bacteriuria: Secondary | ICD-10-CM

## 2020-10-11 DIAGNOSIS — O09299 Supervision of pregnancy with other poor reproductive or obstetric history, unspecified trimester: Secondary | ICD-10-CM

## 2020-10-11 DIAGNOSIS — O09899 Supervision of other high risk pregnancies, unspecified trimester: Secondary | ICD-10-CM

## 2020-10-11 NOTE — Progress Notes (Signed)
ROB   GBS + in urine per notes treat in labor.  Pt would like cervix check today.
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
## 2020-10-11 NOTE — Patient Instructions (Signed)
Group B Streptococcus Infection During Pregnancy °Group B Streptococcus (GBS) is a type of bacteria that is often found in healthy people. It is commonly found in the rectum, vagina, and intestines. In people who are healthy and not pregnant, the bacteria rarely cause serious illness or complications. However, women who test positive for GBS during pregnancy can pass the bacteria to the baby during childbirth. This can cause serious infection in the baby after birth. °Women with GBS may also have infections during their pregnancy or soon after childbirth. The infections include urinary tract infections (UTIs) or infections of the uterus. GBS also increases a woman's risk of complications during pregnancy, such as early labor or delivery, miscarriage, or stillbirth. Routine testing for GBS is recommended for all pregnant women. °What are the causes? °This condition is caused by bacteria called Streptococcus agalactiae. °What increases the risk? °You may have a higher risk for GBS infection during pregnancy if you had one during a past pregnancy. °What are the signs or symptoms? °In most cases, GBS infection does not cause symptoms in pregnant women. If symptoms exist, they may include: °· Labor that starts before the 37th week of pregnancy. °· A UTI or bladder infection. This may cause a fever, frequent urination, or pain and burning during urination. °· Fever during labor. There can also be a rapid heartbeat in the mother or baby. °Rare but serious symptoms of a GBS infection in women include: °· Blood infection (septicemia). This may cause fever, chills, or confusion. °· Lung infection (pneumonia). This may cause fever, chills, cough, rapid breathing, chest pain, or difficulty breathing. °· Bone, joint, skin, or soft tissue infection. °How is this diagnosed? °You may be screened for GBS between week 35 and week 37 of pregnancy. If you have symptoms of preterm labor, you may be screened earlier. This condition is  diagnosed based on lab test results from: °· A swab of fluid from the vagina and rectum. °· A urine sample. °How is this treated? °This condition is treated with antibiotic medicine. Antibiotic medicine may be given: °· To you when you go into labor, or as soon as your water breaks. The medicines will continue until after you give birth. If you are having a cesarean delivery, you do not need antibiotics unless your water has broken. °· To your baby, if he or she requires treatment. Your health care provider will check your baby to decide if he or she needs antibiotics to prevent a serious infection. °Follow these instructions at home: °· Take over-the-counter and prescription medicines only as told by your health care provider. °· Take your antibiotic medicine as told by your health care provider. Do not stop taking the antibiotic even if you start to feel better. °· Keep all pre-birth (prenatal) visits and follow-up visits as told by your health care provider. This is important. °Contact a health care provider if: °· You have pain or burning when you urinate. °· You have to urinate more often than usual. °· You have a fever or chills. °· You develop a bad-smelling vaginal discharge. °Get help right away if: °· Your water breaks. °· You go into labor. °· You have severe pain in your abdomen. °· You have difficulty breathing. °· You have chest pain. °These symptoms may represent a serious problem that is an emergency. Do not wait to see if the symptoms will go away. Get medical help right away. Call your local emergency services (911 in the [place] Do not drive yourself to   the hospital. Summary  GBS is a type of bacteria that is common in healthy people.  During pregnancy, colonization with GBS can cause serious complications for you or your baby.  Your health care provider will screen you between 35 and 37 weeks of pregnancy to determine if you are colonized with GBS.  If you are colonized with GBS during  pregnancy, your health care provider will recommend antibiotics through an IV during labor.  After delivery, your baby will be evaluated for complications related to potential GBS infection and may require antibiotics to prevent a serious infection. This information is not intended to replace advice given to you by your health care provider. Make sure you discuss any questions you have with your health care provider. Document Revised: 06/22/2019 Document Reviewed: 06/22/2019 Elsevier Patient Education  2020 [employer]. Third Trimester of Pregnancy  The third trimester is from week 28 through week 40 (months 7 through 9). This trimester is when your unborn baby (fetus) is growing very fast. At the end of the ninth month, the unborn baby is about 20 inches in length. It weighs about 6-10 pounds. Follow these instructions at home: Medicines  Take over-the-counter and prescription medicines only as told by your doctor. Some medicines are safe and some medicines are not safe during pregnancy.  Take a prenatal vitamin that contains at least 600 micrograms (mcg) of folic acid.  If you have trouble pooping (constipation), take medicine that will make your stool soft (stool softener) if your doctor approves. Eating and drinking   Eat regular, healthy meals.  Avoid raw meat and uncooked cheese.  If you get low calcium from the food you eat, talk to your doctor about taking a daily calcium supplement.  Eat four or five small meals rather than three large meals a day.  Avoid foods that are high in fat and sugars, such as fried and sweet foods.  To prevent constipation: ? Eat foods that are high in fiber, like fresh fruits and vegetables, whole grains, and beans. ? Drink enough fluids to keep your pee (urine) clear or pale yellow. Activity  Exercise only as told by your doctor. Stop exercising if you start to have cramps.  Avoid heavy lifting, wear low heels, and sit up straight.  Do not  exercise if it is too hot, too humid, or if you are in a place of great height (high altitude).  You may continue to have sex unless your doctor tells you not to. Relieving pain and discomfort  Wear a good support bra if your breasts are tender.  Take frequent breaks and rest with your legs raised if you have leg cramps or low back pain.  Take warm water baths (sitz baths) to soothe pain or discomfort caused by hemorrhoids. Use hemorrhoid cream if your doctor approves.  If you develop puffy, bulging veins (varicose veins) in your legs: ? Wear support hose or compression stockings as told by your doctor. ? Raise (elevate) your feet for 15 minutes, 3-4 times a day. ? Limit salt in your food. Safety  Wear your seat belt when driving.  Make a list of emergency phone numbers, including numbers for family, friends, the hospital, and police and fire departments. Preparing for your baby's arrival To prepare for the arrival of your baby:  Take prenatal classes.  Practice driving to the hospital.  Visit the hospital and tour the maternity area.  Talk to your work about taking leave once the baby comes.  Pack your  hospital bag.  Prepare the baby's room.  Go to your doctor visits.  Buy a rear-facing car seat. Learn how to install it in your car. General instructions  Do not use hot tubs, steam rooms, or saunas.  Do not use any products that contain nicotine or tobacco, such as cigarettes and e-cigarettes. If you need help quitting, ask your doctor.  Do not drink alcohol.  Do not douche or use tampons or scented sanitary pads.  Do not cross your legs for long periods of time.  Do not travel for long distances unless you must. Only do so if your doctor says it is okay.  Visit your dentist if you have not gone during your pregnancy. Use a soft toothbrush to brush your teeth. Be gentle when you floss.  Avoid cat litter boxes and soil used by cats. These carry germs that can  cause birth defects in the baby and can cause a loss of your baby (miscarriage) or stillbirth.  Keep all your prenatal visits as told by your doctor. This is important. Contact a doctor if:  You are not sure if you are in labor or if your water has broken.  You are dizzy.  You have mild cramps or pressure in your lower belly.  You have a nagging pain in your belly area.  You continue to feel sick to your stomach, you throw up, or you have watery poop.  You have bad smelling fluid coming from your vagina.  You have pain when you pee. Get help right away if:  You have a fever.  You are leaking fluid from your vagina.  You are spotting or bleeding from your vagina.  You have severe belly cramps or pain.  You lose or gain weight quickly.  You have trouble catching your breath and have chest pain.  You notice sudden or extreme puffiness (swelling) of your face, hands, ankles, feet, or legs.  You have not felt the baby move in over an hour.  You have severe headaches that do not go away with medicine.  You have trouble seeing.  You are leaking, or you are having a gush of fluid, from your vagina before you are 37 weeks.  You have regular belly spasms (contractions) before you are 37 weeks. Summary  The third trimester is from week 28 through week 40 (months 7 through 9). This time is when your unborn baby is growing very fast.  Follow your doctor's advice about medicine, food, and activity.  Get ready for the arrival of your baby by taking prenatal classes, getting all the baby items ready, preparing the baby's room, and visiting your doctor to be checked.  Get help right away if you are bleeding from your vagina, or you have chest pain and trouble catching your breath, or if you have not felt your baby move in over an hour. This information is not intended to replace advice given to you by your health care provider. Make sure you discuss any questions you have with your  health care provider. Document Revised: 03/19/2019 Document Reviewed: 01/01/2017 Elsevier Patient Education  2020 [employer].

## 2020-10-11 NOTE — Addendum Note (Signed)
Addended by: Warden Fillers on: 10/11/2020 03:43 PM   Modules accepted: Orders, SmartSet

## 2020-10-13 LAB — CERVICOVAGINAL ANCILLARY ONLY
Chlamydia: NEGATIVE
Comment: NEGATIVE
Comment: NEGATIVE
Comment: NORMAL
Neisseria Gonorrhea: NEGATIVE
Trichomonas: NEGATIVE

## 2020-10-18 ENCOUNTER — Encounter: Payer: Medicaid Other | Admitting: Obstetrics

## 2020-10-19 ENCOUNTER — Other Ambulatory Visit: Payer: Self-pay

## 2020-10-19 ENCOUNTER — Ambulatory Visit (INDEPENDENT_AMBULATORY_CARE_PROVIDER_SITE_OTHER): Payer: Medicaid Other | Admitting: Nurse Practitioner

## 2020-10-19 VITALS — BP 109/67 | HR 88 | Wt 235.0 lb

## 2020-10-19 DIAGNOSIS — O0990 Supervision of high risk pregnancy, unspecified, unspecified trimester: Secondary | ICD-10-CM

## 2020-10-19 DIAGNOSIS — Z3A37 37 weeks gestation of pregnancy: Secondary | ICD-10-CM

## 2020-10-19 DIAGNOSIS — O09299 Supervision of pregnancy with other poor reproductive or obstetric history, unspecified trimester: Secondary | ICD-10-CM

## 2020-10-19 DIAGNOSIS — R8271 Bacteriuria: Secondary | ICD-10-CM

## 2020-10-19 NOTE — Progress Notes (Signed)
ROB  CC: having some stomach and bk pain. Would lie to have cervix chkd.

## 2020-10-19 NOTE — Progress Notes (Signed)
    Subjective:  Faith Woods is a 24 y.o. [id number] at [redacted]w[redacted]d being seen today for ongoing prenatal care.  She is currently monitored for the following issues for this low-risk pregnancy and has History of preterm delivery, currently pregnant; Supervision of high risk pregnancy, antepartum; Genetic carrier; GBS bacteriuria; H/O postpartum hemorrhage, currently pregnant; and [redacted] weeks gestation of pregnancy on their problem list.  Patient reports occasional contractions.  Contractions: Irritability. Vag. Bleeding: None.  Movement: Present. Denies leaking of fluid.   The following portions of the patient's history were reviewed and updated as appropriate: allergies, current medications, past family history, past medical history, past social history, past surgical history and problem list. Problem list updated.  Objective:   Vitals:   10/19/20 1536  BP: 109/67  Pulse: 88  Weight: 235 lb (106.6 kg)    Fetal Status: Fetal Heart Rate (bpm): 144 Fundal Height: 40 cm Movement: Present  Presentation: Vertex  General:  Alert, oriented and cooperative. Patient is in no acute distress.  Skin: Skin is warm and dry. No rash noted.   Cardiovascular: Normal heart rate noted  Respiratory: Normal respiratory effort, no problems with respiration noted  Abdomen: Soft, gravid, appropriate for gestational age. Pain/Pressure: Present     Pelvic:  Cervical exam performed Dilation: Closed Effacement (%): Thick Station: -3  Extremities: Normal range of motion.  Edema: None  Mental Status: Normal mood and affect. Normal behavior. Normal judgment and thought content.   Urinalysis:      Assessment and Plan:  Pregnancy: [id number] at [redacted]w[redacted]d  1. Supervision of high risk pregnancy, antepartum Doing well Advised to wear maternity support belt as baby comes straight forward  2. GBS bacteriuria Will treat GBS in labor  3. H/O postpartum hemorrhage, currently pregnant    Term labor symptoms and general  obstetric precautions including but not limited to vaginal bleeding, contractions, leaking of fluid and fetal movement were reviewed in detail with the patient. Please refer to After Visit Summary for other counseling recommendations.  Return in about 1 week (around 10/26/2020) for ROB with MD for delivery plan.  Nolene Bernheim, RN, MSN, NP-BC Nurse Practitioner, [facility] for [employer], [facility] 10/19/2020 4:20 PM

## 2020-10-27 ENCOUNTER — Encounter: Payer: Self-pay | Admitting: Obstetrics and Gynecology

## 2020-10-27 ENCOUNTER — Other Ambulatory Visit: Payer: Self-pay

## 2020-10-27 ENCOUNTER — Ambulatory Visit (INDEPENDENT_AMBULATORY_CARE_PROVIDER_SITE_OTHER): Payer: Medicaid Other | Admitting: Obstetrics and Gynecology

## 2020-10-27 VITALS — BP 106/68 | HR 96 | Wt 237.1 lb

## 2020-10-27 DIAGNOSIS — O0990 Supervision of high risk pregnancy, unspecified, unspecified trimester: Secondary | ICD-10-CM

## 2020-10-27 DIAGNOSIS — R8271 Bacteriuria: Secondary | ICD-10-CM

## 2020-10-27 DIAGNOSIS — Z148 Genetic carrier of other disease: Secondary | ICD-10-CM

## 2020-10-27 DIAGNOSIS — O09299 Supervision of pregnancy with other poor reproductive or obstetric history, unspecified trimester: Secondary | ICD-10-CM

## 2020-10-27 NOTE — Progress Notes (Signed)
Patient presents for ROB. Patient has no concerns today. She desires to have cervix checked.

## 2020-10-27 NOTE — Patient Instructions (Signed)

## 2020-10-27 NOTE — Progress Notes (Signed)
Subjective:  Faith Woods is a 24 y.o. [id number] at [redacted]w[redacted]d being seen today for ongoing prenatal care.  She is currently monitored for the following issues for this low-risk pregnancy and has History of preterm delivery, currently pregnant; Supervision of high risk pregnancy, antepartum; Genetic carrier; GBS bacteriuria; H/O postpartum hemorrhage, currently pregnant; and [redacted] weeks gestation of pregnancy on their problem list.  Patient reports general discomforts of pregnancy.  Contractions: Irregular. Vag. Bleeding: None.  Movement: Present. Denies leaking of fluid.   The following portions of the patient's history were reviewed and updated as appropriate: allergies, current medications, past family history, past medical history, past social history, past surgical history and problem list. Problem list updated.  Objective:   Vitals:   10/27/20 1535  BP: 106/68  Pulse: 96  Weight: 237 lb 1.6 oz (107.5 kg)    Fetal Status: Fetal Heart Rate (bpm): 140   Movement: Present     General:  Alert, oriented and cooperative. Patient is in no acute distress.  Skin: Skin is warm and dry. No rash noted.   Cardiovascular: Normal heart rate noted  Respiratory: Normal respiratory effort, no problems with respiration noted  Abdomen: Soft, gravid, appropriate for gestational age. Pain/Pressure: Present     Pelvic:  Cervical exam performed        Extremities: Normal range of motion.  Edema: None  Mental Status: Normal mood and affect. Normal behavior. Normal judgment and thought content.   Urinalysis:      Assessment and Plan:  Pregnancy: [id number] at [redacted]w[redacted]d  1. Supervision of high risk pregnancy, antepartum Labor precautions IOL at 41 weeks  2. GBS bacteriuria Tx while in labor  3. Genetic carrier Stable  4. H/O postpartum hemorrhage, currently pregnant Monitor Post partum  Term labor symptoms and general obstetric precautions including but not limited to vaginal bleeding, contractions,  leaking of fluid and fetal movement were reviewed in detail with the patient. Please refer to After Visit Summary for other counseling recommendations.  Return in about 1 week (around 11/03/2020) for OB visit, face to face, any provider.   Hermina Staggers, MD

## 2020-10-28 ENCOUNTER — Telehealth (HOSPITAL_COMMUNITY): Payer: Self-pay | Admitting: *Deleted

## 2020-10-28 ENCOUNTER — Encounter (HOSPITAL_COMMUNITY): Payer: Self-pay | Admitting: *Deleted

## 2020-10-28 NOTE — Telephone Encounter (Signed)
Preadmission screen  

## 2020-11-01 ENCOUNTER — Inpatient Hospital Stay (HOSPITAL_COMMUNITY)
Admission: AD | Admit: 2020-11-01 | Discharge: 2020-11-03 | DRG: 798 | Disposition: A | Discharge status: Home / Self Care | Payer: Medicaid Other | Attending: Family Medicine | Admitting: Family Medicine

## 2020-11-01 ENCOUNTER — Encounter (HOSPITAL_COMMUNITY): Payer: Self-pay | Admitting: Obstetrics & Gynecology

## 2020-11-01 ENCOUNTER — Inpatient Hospital Stay (HOSPITAL_COMMUNITY): Payer: Medicaid Other | Admitting: Anesthesiology

## 2020-11-01 ENCOUNTER — Other Ambulatory Visit: Payer: Self-pay

## 2020-11-01 DIAGNOSIS — Z3A39 39 weeks gestation of pregnancy: Secondary | ICD-10-CM | POA: Diagnosis not present

## 2020-11-01 DIAGNOSIS — B951 Streptococcus, group B, as the cause of diseases classified elsewhere: Secondary | ICD-10-CM

## 2020-11-01 DIAGNOSIS — Z9851 Tubal ligation status: Secondary | ICD-10-CM

## 2020-11-01 DIAGNOSIS — Z20822 Contact with and (suspected) exposure to covid-19: Secondary | ICD-10-CM | POA: Diagnosis present

## 2020-11-01 DIAGNOSIS — O0990 Supervision of high risk pregnancy, unspecified, unspecified trimester: Secondary | ICD-10-CM

## 2020-11-01 DIAGNOSIS — Z302 Encounter for sterilization: Secondary | ICD-10-CM | POA: Diagnosis not present

## 2020-11-01 DIAGNOSIS — O09899 Supervision of other high risk pregnancies, unspecified trimester: Secondary | ICD-10-CM

## 2020-11-01 DIAGNOSIS — Z148 Genetic carrier of other disease: Secondary | ICD-10-CM

## 2020-11-01 DIAGNOSIS — O99824 Streptococcus B carrier state complicating childbirth: Secondary | ICD-10-CM | POA: Diagnosis present

## 2020-11-01 DIAGNOSIS — O09299 Supervision of pregnancy with other poor reproductive or obstetric history, unspecified trimester: Secondary | ICD-10-CM

## 2020-11-01 DIAGNOSIS — Z3490 Encounter for supervision of normal pregnancy, unspecified, unspecified trimester: Secondary | ICD-10-CM | POA: Diagnosis present

## 2020-11-01 DIAGNOSIS — R8271 Bacteriuria: Secondary | ICD-10-CM | POA: Diagnosis present

## 2020-11-01 DIAGNOSIS — O26893 Other specified pregnancy related conditions, third trimester: Secondary | ICD-10-CM | POA: Diagnosis present

## 2020-11-01 LAB — TYPE AND SCREEN
ABO/RH(D): O POS
Antibody Screen: NEGATIVE

## 2020-11-01 LAB — CBC
HCT: 35.6 % — ABNORMAL LOW (ref 36.0–46.0)
Hemoglobin: 11.3 g/dL — ABNORMAL LOW (ref 12.0–15.0)
MCH: 27.6 pg (ref 26.0–34.0)
MCHC: 31.7 g/dL (ref 30.0–36.0)
MCV: 87 fL (ref 80.0–100.0)
Platelets: 193 10*3/uL (ref 150–400)
RBC: 4.09 MIL/uL (ref 3.87–5.11)
RDW: 13.6 % (ref 11.5–15.5)
WBC: 7.8 10*3/uL (ref 4.0–10.5)
nRBC: 0 % (ref 0.0–0.2)

## 2020-11-01 LAB — RESPIRATORY PANEL BY RT PCR (FLU A&B, COVID)
Influenza A by PCR: NEGATIVE
Influenza B by PCR: NEGATIVE
SARS Coronavirus 2 by RT PCR: NEGATIVE

## 2020-11-01 LAB — RPR: RPR Ser Ql: NONREACTIVE

## 2020-11-01 MED ORDER — WITCH HAZEL-GLYCERIN EX PADS: 1.0000 "application " | MEDICATED_PAD | CUTANEOUS | Status: DC | PRN

## 2020-11-01 MED ORDER — MISOPROSTOL 25 MCG QUARTER TABLET: 25.0000 ug | ORAL_TABLET | ORAL | Status: DC | PRN

## 2020-11-01 MED ORDER — LIDOCAINE HCL (PF) 1 % IJ SOLN
INTRAMUSCULAR | Status: DC | PRN
  Administered 2020-11-01: 10 mL via EPIDURAL

## 2020-11-01 MED ORDER — ZOLPIDEM TARTRATE 5 MG PO TABS: 5.0000 mg | ORAL_TABLET | Freq: Every evening | ORAL | Status: DC | PRN

## 2020-11-01 MED ORDER — ONDANSETRON HCL 4 MG PO TABS: 4.0000 mg | ORAL_TABLET | ORAL | Status: DC | PRN

## 2020-11-01 MED ORDER — DIPHENHYDRAMINE HCL 25 MG PO CAPS: 25.0000 mg | ORAL_CAPSULE | Freq: Four times a day (QID) | ORAL | Status: DC | PRN

## 2020-11-01 MED ORDER — SODIUM CHLORIDE 0.9 % IV SOLN
2.0000 g | Freq: Once | INTRAVENOUS | Status: AC
  Administered 2020-11-01: 2 g via INTRAVENOUS
  Filled 2020-11-01: qty 2000

## 2020-11-01 MED ORDER — SODIUM CHLORIDE 0.9 % IV SOLN
1.0000 g | INTRAVENOUS | Status: DC
  Filled 2020-11-01 (×2): qty 1000

## 2020-11-01 MED ORDER — OXYTOCIN BOLUS FROM INFUSION
333.0000 mL | Freq: Once | INTRAVENOUS | Status: AC
  Administered 2020-11-01: 333 mL via INTRAVENOUS

## 2020-11-01 MED ORDER — ONDANSETRON HCL 4 MG/2ML IJ SOLN: 4.0000 mg | Freq: Four times a day (QID) | INTRAMUSCULAR | Status: DC | PRN

## 2020-11-01 MED ORDER — SODIUM CHLORIDE 0.9 % IV SOLN: 5.0000 10*6.[IU] | Freq: Once | INTRAVENOUS | Status: DC

## 2020-11-01 MED ORDER — MEDROXYPROGESTERONE ACETATE 150 MG/ML IM SUSP: 150.0000 mg | INTRAMUSCULAR | Status: DC | PRN

## 2020-11-01 MED ORDER — EPHEDRINE 5 MG/ML INJ: 10.0000 mg | INTRAVENOUS | Status: DC | PRN

## 2020-11-01 MED ORDER — OXYCODONE-ACETAMINOPHEN 5-325 MG PO TABS: 1.0000 | ORAL_TABLET | ORAL | Status: DC | PRN

## 2020-11-01 MED ORDER — SODIUM CHLORIDE 0.9 % IV SOLN
1.0000 g | INTRAVENOUS | Status: DC
  Administered 2020-11-01: 1 g via INTRAVENOUS
  Filled 2020-11-01 (×6): qty 1000

## 2020-11-01 MED ORDER — LIDOCAINE HCL (PF) 1 % IJ SOLN: 30.0000 mL | INTRAMUSCULAR | Status: DC | PRN

## 2020-11-01 MED ORDER — COCONUT OIL OIL: 1.0000 "application " | TOPICAL_OIL | Status: DC | PRN

## 2020-11-01 MED ORDER — PHENYLEPHRINE 40 MCG/ML (10ML) SYRINGE FOR IV PUSH (FOR BLOOD PRESSURE SUPPORT)
80.0000 ug | PREFILLED_SYRINGE | INTRAVENOUS | Status: DC | PRN
  Filled 2020-11-01: qty 10

## 2020-11-01 MED ORDER — SODIUM CHLORIDE 0.9 % IV SOLN: 2.0000 g | Freq: Once | INTRAVENOUS | Status: DC

## 2020-11-01 MED ORDER — OXYTOCIN-SODIUM CHLORIDE 30-0.9 UT/500ML-% IV SOLN: 1.0000 m[IU]/min | INTRAVENOUS | Status: DC

## 2020-11-01 MED ORDER — LACTATED RINGERS IV SOLN
500.0000 mL | Freq: Once | INTRAVENOUS | Status: AC
  Administered 2020-11-01: 500 mL via INTRAVENOUS

## 2020-11-01 MED ORDER — OXYTOCIN-SODIUM CHLORIDE 30-0.9 UT/500ML-% IV SOLN
2.5000 [IU]/h | INTRAVENOUS | Status: DC
  Filled 2020-11-01: qty 500

## 2020-11-01 MED ORDER — ONDANSETRON HCL 4 MG/2ML IJ SOLN
4.0000 mg | INTRAMUSCULAR | Status: DC | PRN
  Administered 2020-11-01: 4 mg via INTRAVENOUS
  Filled 2020-11-01: qty 2

## 2020-11-01 MED ORDER — FENTANYL CITRATE (PF) 100 MCG/2ML IJ SOLN
INTRAMUSCULAR | Status: AC
  Administered 2020-11-01: 50 ug via INTRAVENOUS
  Filled 2020-11-01: qty 2

## 2020-11-01 MED ORDER — LACTATED RINGERS IV SOLN
INTRAVENOUS | Status: DC
Start: 2020-11-01 — End: 2020-11-01

## 2020-11-01 MED ORDER — SIMETHICONE 80 MG PO CHEW: 80.0000 mg | CHEWABLE_TABLET | ORAL | Status: DC | PRN

## 2020-11-01 MED ORDER — MEASLES, MUMPS & RUBELLA VAC IJ SOLR: 0.5000 mL | Freq: Once | INTRAMUSCULAR | Status: DC

## 2020-11-01 MED ORDER — FENTANYL CITRATE (PF) 100 MCG/2ML IJ SOLN
50.0000 ug | INTRAMUSCULAR | Status: DC | PRN
  Administered 2020-11-01 (×2): 100 ug via INTRAVENOUS
  Filled 2020-11-01 (×2): qty 2

## 2020-11-01 MED ORDER — TRANEXAMIC ACID-NACL 1000-0.7 MG/100ML-% IV SOLN
1000.0000 mg | INTRAVENOUS | Status: AC
  Administered 2020-11-01: 1000 mg via INTRAVENOUS
  Filled 2020-11-01: qty 100

## 2020-11-01 MED ORDER — TETANUS-DIPHTH-ACELL PERTUSSIS 5-2.5-18.5 LF-MCG/0.5 IM SUSY: 0.5000 mL | PREFILLED_SYRINGE | Freq: Once | INTRAMUSCULAR | Status: DC

## 2020-11-01 MED ORDER — ACETAMINOPHEN 325 MG PO TABS: 650.0000 mg | ORAL_TABLET | ORAL | Status: DC | PRN

## 2020-11-01 MED ORDER — FENTANYL-BUPIVACAINE-NACL 0.5-0.125-0.9 MG/250ML-% EP SOLN
12.0000 mL/h | EPIDURAL | Status: DC | PRN
  Filled 2020-11-01: qty 250

## 2020-11-01 MED ORDER — ACETAMINOPHEN 325 MG PO TABS
650.0000 mg | ORAL_TABLET | ORAL | Status: DC | PRN
  Administered 2020-11-03: 650 mg via ORAL
  Filled 2020-11-01: qty 2

## 2020-11-01 MED ORDER — TERBUTALINE SULFATE 1 MG/ML IJ SOLN: 0.2500 mg | Freq: Once | INTRAMUSCULAR | Status: DC | PRN

## 2020-11-01 MED ORDER — SOD CITRATE-CITRIC ACID 500-334 MG/5ML PO SOLN: 30.0000 mL | ORAL | Status: DC | PRN

## 2020-11-01 MED ORDER — SENNOSIDES-DOCUSATE SODIUM 8.6-50 MG PO TABS
2.0000 | ORAL_TABLET | ORAL | Status: DC
  Administered 2020-11-02 (×2): 2 via ORAL
  Filled 2020-11-01 (×2): qty 2

## 2020-11-01 MED ORDER — FERROUS SULFATE 325 (65 FE) MG PO TABS
325.0000 mg | ORAL_TABLET | ORAL | Status: DC
  Filled 2020-11-01: qty 1

## 2020-11-01 MED ORDER — HYDROXYZINE HCL 50 MG PO TABS
50.0000 mg | ORAL_TABLET | Freq: Four times a day (QID) | ORAL | Status: DC | PRN
Start: 2020-11-01 — End: 2020-11-01

## 2020-11-01 MED ORDER — PENICILLIN G POT IN DEXTROSE 60000 UNIT/ML IV SOLN: 3.0000 10*6.[IU] | INTRAVENOUS | Status: DC

## 2020-11-01 MED ORDER — BENZOCAINE-MENTHOL 20-0.5 % EX AERO: 1.0000 "application " | INHALATION_SPRAY | CUTANEOUS | Status: DC | PRN

## 2020-11-01 MED ORDER — SODIUM CHLORIDE (PF) 0.9 % IJ SOLN
INTRAMUSCULAR | Status: DC | PRN
  Administered 2020-11-01: 12 mL/h via EPIDURAL

## 2020-11-01 MED ORDER — SODIUM CHLORIDE 0.9 % IV SOLN: 1.0000 g | INTRAVENOUS | Status: DC

## 2020-11-01 MED ORDER — LACTATED RINGERS IV SOLN: 500.0000 mL | INTRAVENOUS | Status: DC | PRN

## 2020-11-01 MED ORDER — MISOPROSTOL 200 MCG PO TABS
600.0000 ug | ORAL_TABLET | Freq: Once | ORAL | Status: AC
  Administered 2020-11-01: 600 ug via ORAL
  Filled 2020-11-01: qty 3

## 2020-11-01 MED ORDER — IBUPROFEN 600 MG PO TABS
600.0000 mg | ORAL_TABLET | Freq: Four times a day (QID) | ORAL | Status: DC
  Administered 2020-11-01 – 2020-11-03 (×6): 600 mg via ORAL
  Filled 2020-11-01 (×6): qty 1

## 2020-11-01 MED ORDER — FENTANYL CITRATE (PF) 100 MCG/2ML IJ SOLN: 50.0000 ug | Freq: Once | INTRAMUSCULAR | Status: AC

## 2020-11-01 MED ORDER — PHENYLEPHRINE 40 MCG/ML (10ML) SYRINGE FOR IV PUSH (FOR BLOOD PRESSURE SUPPORT)
80.0000 ug | PREFILLED_SYRINGE | INTRAVENOUS | Status: DC | PRN
  Administered 2020-11-01: 80 ug via INTRAVENOUS

## 2020-11-01 MED ORDER — DIPHENHYDRAMINE HCL 50 MG/ML IJ SOLN: 12.5000 mg | INTRAMUSCULAR | Status: DC | PRN

## 2020-11-01 MED ORDER — METHYLERGONOVINE MALEATE 0.2 MG/ML IJ SOLN
0.2000 mg | Freq: Once | INTRAMUSCULAR | Status: AC
  Administered 2020-11-01: 0.2 mg via INTRAMUSCULAR
  Filled 2020-11-01: qty 1

## 2020-11-01 MED ORDER — DIBUCAINE (PERIANAL) 1 % EX OINT: 1.0000 "application " | TOPICAL_OINTMENT | CUTANEOUS | Status: DC | PRN

## 2020-11-01 MED ORDER — PRENATAL MULTIVITAMIN CH
1.0000 | ORAL_TABLET | Freq: Every day | ORAL | Status: DC
  Administered 2020-11-03: 1 via ORAL
  Filled 2020-11-01: qty 1

## 2020-11-01 NOTE — H&P (Signed)
OBSTETRIC ADMISSION HISTORY AND PHYSICAL  Faith Woods is a 24 y.o. female [phone] with IUP at 3w5dby lmp presenting for LABOR. She reports +FMs, No LOF, no VB, no blurry vision, headaches or peripheral edema, and RUQ pain.  She plans on bottle feeding. She request BTL for birth control. 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 Ischemic bowel syndrome ([place]   . Medical history non-contributory     Past Surgical History: Past Surgical History:  Procedure Laterality Date  . NO PAST SURGERIES      Obstetrical History: OB History    Gravida  4   Para  3   Term  2   Preterm  1   AB      Living  3     SAB      TAB      Ectopic      Multiple  0   Live Births  3           Social History Social History   Socioeconomic History  . Marital status: Married    Spouse name: Not on file  . Number of children: 3  . Years of education: Not on file  . Highest education level: Not on file  Occupational History  . Occupation: UNEMPLOYED  Tobacco Use  . Smoking status: Never Smoker  . Smokeless tobacco: Never Used  Vaping Use  . Vaping Use: Never used  Substance and Sexual Activity  . Alcohol use: No  . Drug use: No  . Sexual activity: Yes    Birth control/protection: None  Other Topics Concern  . Not on file  Social History Narrative  . Not on file   Social Determinants of Health   Financial Resource Strain:   . Difficulty of Paying Living Expenses: Not on file  Food Insecurity:   . Worried About RCharity fundraiserin the Last Year: Not on file  . Ran Out of Food in the Last Year: Not on file   Transportation Needs:   . Lack of Transportation (Medical): Not on file  . Lack of Transportation (Non-Medical): Not on file  Physical Activity:   . Days of Exercise per Week: Not on file  . Minutes of Exercise per Session: Not on file  Stress:   . Feeling of Stress : Not on file  Social Connections:   . Frequency of Communication with Friends and Family: Not on file  . Frequency of Social Gatherings with Friends and Family: Not on file  . Attends Religious Services: Not on file  . Active Member of Clubs or Organizations: Not on file  . Attends CArchivistMeetings: Not on file  . Marital Status: Not on file    Family History: Family History  Problem Relation Age of Onset  . Healthy Mother   . Healthy Father   . Hypertension Father   . Diabetes Paternal Grandfather     Allergies: No Known Allergies  Medications Prior to Admission  Medication Sig Dispense Refill Last Dose  . acetaminophen (TYLENOL) 500 MG tablet Take 2 tablets (1,000 mg total) by mouth every 8 (eight)  hours as needed for headache. 30 tablet 3 10/31/2020 at Unknown time  . cyclobenzaprine (FLEXERIL) 10 MG tablet Take 1 tablet (10 mg total) by mouth every 8 (eight) hours as needed for muscle spasms. 30 tablet 2 Past Month at Unknown time  . Prenat-Fe Poly-Methfol-FA-DHA (VITAFOL ULTRA) 29-0.6-0.4-200 MG CAPS Take 1 tablet by mouth daily. 30 capsule 12 10/31/2020 at Unknown time  . Blood Pressure Monitoring (BLOOD PRESSURE KIT) DEVI 1 kit by Does not apply route once a week. Check Blood Pressure regularly and record readings into the Babys[employer]s App.  Large Cuff.  DX O90.0 1 each 0   . Elastic Bandages & Supports (COMFORT FIT MATERNITY SUPP LG) MISC Wear daily when ambulating 1 each 0   . ondansetron (ZOFRAN ODT) 4 MG disintegrating tablet Take 1 tablet (4 mg total) by mouth every 8 (eight) hours as needed for nausea or vomiting. 20 tablet 0   . promethazine (PHENERGAN) 25 MG tablet Take 1 tablet (25 mg  total) by mouth every 6 (six) hours as needed for nausea or vomiting. 30 tablet 0      Review of Systems   All systems reviewed and negative except as stated in HPI  Blood pressure 116/67, pulse 86, temperature 98.3 F (36.8 C), temperature source Oral, resp. rate 18, last menstrual period 01/14/2020, currently breastfeeding. General appearance: alert, cooperative and appears stated age Lungs: normal respiratory effort  Abdomen: soft, non-tender; bowel sounds normal Pelvic: adequate Extremities: Homans sign is negative, no sign of DVT  Presentation: cephalic Fetal monitoring: baseline 125, mod variability, pos accels, no decels  Uterine activity: q3-4 min  Dilation: 4 Effacement (%): 90 Station: -2 Exam by:: weston,rn   Prenatal labs: ABO, Rh: O/Positive/-- (08/30 1004) Antibody: Negative (08/30 1004) Rubella: 2.02 (08/30 1004) RPR: Non Reactive (08/30 1004)  HBsAg: Negative (08/30 1004)  HIV: Non Reactive (08/30 1004)  GBS:   positive 2 hr Glucola nl Genetic screening  Low risk NIPs, SMA Increased carrier risk, s/p genetic ccounseling  Anatomy US Normal   Prenatal Transfer Tool  Maternal Diabetes: No Genetic Screening: normal   Maternal Ultrasounds/Referrals: Normal Fetal Ultrasounds or other Referrals:  None Maternal Substance Abuse:  No Significant Maternal Medications:  None Significant Maternal Lab Results: Group B Strep positive  No results found for this or any previous visit (from the past 24 hour(s)).  Patient Active Problem List   Diagnosis Date Noted  . [redacted] weeks gestation of pregnancy 10/11/2020  . GBS bacteriuria 07/22/2020  . H/O postpartum hemorrhage, currently pregnant 07/22/2020  . 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 Supervision of high risk pregnancy, antepartum 04/12/2020  . History of preterm delivery, currently pregnant 11/18/2018    Assessment/Plan:  Faith Woods is a 24 y.o. [id number] at 28w5dhere for labor. Continue expectant  mgmt  #Labor: Expectant mgmt for now  #Hx of PPH: with last delivery EBL 1086, consider TXA at delivery.   #Pain: Epidural, pain meds prn #FWB: Cat I  #ID:  GBS pos, start ampicillin given multiparous status, 4 cm on admission  #MOF: bottle #MOC:considering BTL  #Circ:  yes  JJanet Berlin MD  11/01/2020, 4:55 AM

## 2020-11-01 NOTE — Progress Notes (Addendum)
Faith Woods is a 24 y.o. [id number] at [redacted]w[redacted]d by ultrasound admitted for spontaneous onset of labor.  Subjective: Patient observed resting with eyes clothes. No distress noted at this time.  Objective: BP (!) 107/55   Pulse 85   Temp 98.8 F (37.1 C) (Oral)   Resp 16   LMP 01/14/2020 (Exact Date)   SpO2 97%  No intake/output data recorded. No intake/output data recorded.  FHT:  FHR: 125 bpm, variability: moderate,  accelerations:  Present,  decelerations:  Absent UC:   regular, every 5-7 minutes SVE:   Dilation: 8 Effacement (%): 100 Station: -1 Exam by:: jasmine bates,snm  Labs: Lab Results  Component Value Date   WBC 7.8 11/01/2020   HGB 11.3 (L) 11/01/2020   HCT 35.6 (L) 11/01/2020   MCV 87.0 11/01/2020   PLT 193 11/01/2020    Assessment / Plan: Spontaneous labor, progressing normally  Labor: Progressing normally Preeclampsia:  n/a Fetal Wellbeing:  Category I Pain Control:  Epidural I/D:  n/a Anticipated MOD:  NSVD  Sande Rives 11/01/2020, 10:27 AM  Midwife attestation I agree with the documentation in the student's note.   Donette Larry, CNM 10:34 AM

## 2020-11-01 NOTE — Discharge Summary (Signed)
Postpartum Discharge Summary  Date of Service updated 11/03/20     Patient Name: Faith Woods DOB: July 06, 1996 MRN: [id number]  Date of admission: 11/01/2020 Delivery date:11/01/2020  Delivering provider: Julianne Handler  Date of discharge: 11/03/2020  Admitting diagnosis: Encounter for elective induction of labor [Z34.90] Intrauterine pregnancy: [redacted]w[redacted]d    Secondary diagnosis:  Active Problems:   History of preterm delivery, currently pregnant   Supervision of high risk pregnancy, antepartum   Genetic carrier   GBS bacteriuria   H/O postpartum hemorrhage, currently pregnant   Encounter for elective induction of labor   SVD (spontaneous vaginal delivery)  Additional problems: none    Discharge diagnosis: Term Pregnancy Delivered                                              Post partum procedures:postpartum tubal ligation Augmentation: Pitocin Complications: None  Hospital course: Onset of Labor With Vaginal Delivery      24y.o. yo [id number] admitted in Active Labor on 11/01/2020. Patient had an uncomplicated labor course as follows:  Membrane Rupture Time/Date: 10:45 AM ,11/01/2020   Delivery Method:Vaginal, Spontaneous  Episiotomy: None  Lacerations:  None  Patient had an uncomplicated postpartum course.  She is ambulating, tolerating a regular diet, passing flatus, and urinating well. Patient is discharged home in stable condition on 11/03/20.  Newborn Data: Birth date:11/01/2020  Birth time:2:40 PM  Gender:Female  Living status:Living  Apgars:9 ,10  Weight:3572 g   Magnesium Sulfate received: No BMZ received: No Rhophylac:N/A MMR:N/A T-DaP:no Flu: No Transfusion:No  Physical exam  Vitals:   11/02/20 2124 11/03/20 0156 11/03/20 0613 11/03/20 0858  BP: 122/86 115/75 125/84 115/70  Pulse: 75 66 69 67  Resp: '20 20 20 18  ' Temp: 98.2 F (36.8 C)   98.4 F (36.9 C)  TempSrc: Oral   Oral  SpO2: 100% 100% 100% 99%   General: alert,  cooperative and no distress Lochia: appropriate Uterine Fundus: firm Incision: Dressing is clean, dry, and intact DVT Evaluation: No evidence of DVT seen on physical exam. Labs: Lab Results  Component Value Date   WBC 10.6 (H) 11/02/2020   HGB 11.4 (L) 11/02/2020   HCT 35.2 (L) 11/02/2020   MCV 84.4 11/02/2020   PLT 174 11/02/2020   CMP Latest Ref Rng & Units 09/28/2020  Glucose 70 - 99 mg/dL 107(H)  BUN 6 - 20 mg/dL 6  Creatinine 0.44 - 1.00 mg/dL 0.58  Sodium 135 - 145 mmol/L 135  Potassium 3.5 - 5.1 mmol/L 3.6  Chloride 98 - 111 mmol/L 106  CO2 22 - 32 mmol/L 19(L)  Calcium 8.9 - 10.3 mg/dL 9.3  Total Protein 6.5 - 8.1 g/dL 6.8  Total Bilirubin 0.3 - 1.2 mg/dL 0.2(L)  Alkaline Phos 38 - 126 U/L 73  AST 15 - 41 U/L 16  ALT 0 - 44 U/L 8   Edinburgh Score: Edinburgh Postnatal Depression Scale Screening Tool 05/18/2019  I have been able to laugh and see the funny side of things. 0  I have looked forward with enjoyment to things. 0  I have blamed myself unnecessarily when things went wrong. 2  I have been anxious or worried for no good reason. 0  I have felt scared or panicky for no good reason. 0  Things have been getting on top of me. 0  I have been so unhappy that I have had difficulty sleeping. 0  I have felt sad or miserable. 0  I have been so unhappy that I have been crying. 0  The thought of harming myself has occurred to me. 0  Edinburgh Postnatal Depression Scale Total 2     After visit meds:  Allergies as of 11/03/2020   No Known Allergies     Medication List    STOP taking these medications   Blood Pressure Kit Devi   cyclobenzaprine 10 MG tablet Commonly known as: FLEXERIL   ondansetron 4 MG disintegrating tablet Commonly known as: Zofran ODT   promethazine 25 MG tablet Commonly known as: PHENERGAN     TAKE these medications   acetaminophen 325 MG tablet Commonly known as: Tylenol Take 2 tablets (650 mg total) by mouth every 4 (four) hours  as needed (for pain scale < 4). What changed:   medication strength  how much to take  when to take this  reasons to take this   coconut oil Oil Apply 1 application topically as needed.   Comfort Fit Maternity Supp Lg Misc Wear daily when ambulating   ibuprofen 600 MG tablet Commonly known as: ADVIL Take 1 tablet (600 mg total) by mouth every 6 (six) hours.   Vitafol Ultra 29-0.6-0.4-200 MG Caps Take 1 tablet by mouth daily.        Discharge home in stable condition Infant Feeding: Breast Infant Disposition:home with mother Discharge instruction: per After Visit Summary and Postpartum booklet. Activity: Advance as tolerated. Pelvic rest for 6 weeks.  Diet: routine diet Future Appointments: Future Appointments  Date Time Provider [place]  11/30/2020  2:40 PM Nugent, Gerrie Nordmann, NP [place] None   Follow up Visit:  Please schedule this patient for a In person postpartum visit in 4 weeks with the following provider: Any provider. Additional Postpartum F/U:n/a  Low risk pregnancy complicated by: none Delivery mode:  Vaginal, Spontaneous  Anticipated Birth Control:  BTL done [facility]  16/09/9603 Arrie Senate, MD

## 2020-11-01 NOTE — Anesthesia Preprocedure Evaluation (Signed)
Anesthesia Evaluation  Patient identified by MRN, date of birth, ID band Patient awake    Reviewed: Allergy & Precautions, Patient's Chart, lab work & pertinent test results  History of Anesthesia Complications Negative for: history of anesthetic complications  Airway Mallampati: II  TM Distance: >3 FB Neck ROM: Full    Dental no notable dental hx.    Pulmonary neg pulmonary ROS,    Pulmonary exam normal breath sounds clear to auscultation       Cardiovascular negative cardio ROS Normal cardiovascular exam Rhythm:Regular Rate:Normal     Neuro/Psych negative neurological ROS     GI/Hepatic Neg liver ROS, GERD  Controlled and Medicated,  Endo/Other  negative endocrine ROS  Renal/GU negative Renal ROS     Musculoskeletal negative musculoskeletal ROS (+)   Abdominal   Peds  Hematology negative hematology ROS (+)   Anesthesia Other Findings Day of surgery medications reviewed with the patient.  Reproductive/Obstetrics (+) Pregnancy                             Anesthesia Physical  Anesthesia Plan  ASA: II  Anesthesia Plan: Epidural   Post-op Pain Management:    Induction:   PONV Risk Score and Plan: Treatment may vary due to age or medical condition  Airway Management Planned: Natural Airway  Additional Equipment:   Intra-op Plan:   Post-operative Plan:   Informed Consent: I have reviewed the patients History and Physical, chart, labs and discussed the procedure including the risks, benefits and alternatives for the proposed anesthesia with the patient or authorized representative who has indicated his/her understanding and acceptance.       Plan Discussed with: Anesthesiologist  Anesthesia Plan Comments:         Anesthesia Quick Evaluation

## 2020-11-01 NOTE — MAU Note (Signed)
Reports to MAU complaining of ctx every 2-3 minutes. Denies vaginal bleeding or LOF. +FM.

## 2020-11-01 NOTE — Anesthesia Procedure Notes (Signed)
Epidural Patient location during procedure: OB Start time: 11/01/2020 7:20 AM End time: 11/01/2020 7:35 AM  Staffing Anesthesiologist: Mellody Dance, MD Performed: anesthesiologist   Preanesthetic Checklist Completed: patient identified, IV checked, site marked, risks and benefits discussed, monitors and equipment checked, pre-op evaluation and timeout performed  Epidural Patient position: sitting Prep: DuraPrep Patient monitoring: heart rate, cardiac monitor, continuous pulse ox and blood pressure Approach: midline Location: L2-L3 Injection technique: LOR saline  Needle:  Needle type: Tuohy  Needle gauge: 17 G Needle length: 9 cm Needle insertion depth: 7 cm Catheter type: closed end flexible Catheter size: 20 Guage Catheter at skin depth: 12 cm Test dose: negative and Other  Assessment Events: blood not aspirated, injection not painful, no injection resistance and negative IV test  Additional Notes Informed consent obtained prior to proceeding including risk of failure, 1% risk of PDPH, risk of minor discomfort and bruising.  Discussed rare but serious complications including epidural abscess, permanent nerve injury, epidural hematoma.  Discussed alternatives to epidural analgesia and patient desires to proceed.  Timeout performed pre-procedure verifying patient name, procedure, and platelet count.  Patient tolerated procedure well.

## 2020-11-01 NOTE — Progress Notes (Signed)
Called to bedside for increased VB. Fundus boggy then firm with massage. Few small clots expressed with vaginal sweep, LUS boggy, bleeding small, Cytotec and analgesic ordered. VSS. QBL 165cc. Pt just voided. Addendum: 1845: TC from RN, additional QBL of 239cc (total 804cc), and fundus boggy, VSS, Methergine IM ordered. Am CBC ordered. Addendum: 2005: F/u TC from RN, VSS, bleeding minimal, pt voided again w/o difficulty, total QBL 848 cc, temp 100.5, will continue to monitor. Night shift labor team updated.

## 2020-11-02 ENCOUNTER — Encounter: Payer: Medicaid Other | Admitting: Obstetrics and Gynecology

## 2020-11-02 ENCOUNTER — Inpatient Hospital Stay (HOSPITAL_COMMUNITY): Payer: Medicaid Other | Admitting: Anesthesiology

## 2020-11-02 ENCOUNTER — Encounter (HOSPITAL_COMMUNITY): Payer: Self-pay | Admitting: Obstetrics and Gynecology

## 2020-11-02 ENCOUNTER — Encounter (HOSPITAL_COMMUNITY)
Admission: AD | Disposition: A | Discharge status: Home / Self Care | Payer: Self-pay | Source: Home / Self Care | Attending: Family Medicine

## 2020-11-02 HISTORY — PX: TUBAL LIGATION: SHX77

## 2020-11-02 LAB — CBC
HCT: 35.2 % — ABNORMAL LOW (ref 36.0–46.0)
Hemoglobin: 11.4 g/dL — ABNORMAL LOW (ref 12.0–15.0)
MCH: 27.3 pg (ref 26.0–34.0)
MCHC: 32.4 g/dL (ref 30.0–36.0)
MCV: 84.4 fL (ref 80.0–100.0)
Platelets: 174 10*3/uL (ref 150–400)
RBC: 4.17 MIL/uL (ref 3.87–5.11)
RDW: 13.4 % (ref 11.5–15.5)
WBC: 10.6 10*3/uL — ABNORMAL HIGH (ref 4.0–10.5)
nRBC: 0 % (ref 0.0–0.2)

## 2020-11-02 SURGERY — LIGATION, FALLOPIAN TUBE, POSTPARTUM
Anesthesia: Epidural

## 2020-11-02 MED ORDER — KETOROLAC TROMETHAMINE 30 MG/ML IJ SOLN: 30.0000 mg | Freq: Once | INTRAMUSCULAR | Status: DC | PRN

## 2020-11-02 MED ORDER — OXYCODONE-ACETAMINOPHEN 5-325 MG PO TABS
2.0000 | ORAL_TABLET | ORAL | Status: DC | PRN
  Administered 2020-11-02: 2 via ORAL
  Filled 2020-11-02: qty 2

## 2020-11-02 MED ORDER — ONDANSETRON HCL 4 MG/2ML IJ SOLN
INTRAMUSCULAR | Status: AC
  Filled 2020-11-02: qty 2

## 2020-11-02 MED ORDER — FENTANYL CITRATE (PF) 100 MCG/2ML IJ SOLN
INTRAMUSCULAR | Status: DC | PRN
Start: 2020-11-02 — End: 2020-11-02
  Administered 2020-11-02 (×2): 50 ug via INTRAVENOUS

## 2020-11-02 MED ORDER — SCOPOLAMINE 1 MG/3DAYS TD PT72: 1.0000 | MEDICATED_PATCH | Freq: Once | TRANSDERMAL | Status: DC

## 2020-11-02 MED ORDER — BUPIVACAINE HCL 0.5 % IJ SOLN
INTRAMUSCULAR | Status: DC | PRN
  Administered 2020-11-02: 20 mL

## 2020-11-02 MED ORDER — MIDAZOLAM HCL 5 MG/5ML IJ SOLN
INTRAMUSCULAR | Status: DC | PRN
  Administered 2020-11-02 (×2): 1 mg via INTRAVENOUS

## 2020-11-02 MED ORDER — NALBUPHINE HCL 10 MG/ML IJ SOLN: 5.0000 mg | INTRAMUSCULAR | Status: DC | PRN

## 2020-11-02 MED ORDER — SODIUM CHLORIDE 0.9% FLUSH: 3.0000 mL | INTRAVENOUS | Status: DC | PRN

## 2020-11-02 MED ORDER — FAMOTIDINE 20 MG PO TABS
40.0000 mg | ORAL_TABLET | Freq: Once | ORAL | Status: AC
  Administered 2020-11-02: 40 mg via ORAL
  Filled 2020-11-02: qty 2

## 2020-11-02 MED ORDER — DIPHENHYDRAMINE HCL 25 MG PO CAPS: 25.0000 mg | ORAL_CAPSULE | ORAL | Status: DC | PRN

## 2020-11-02 MED ORDER — OXYCODONE-ACETAMINOPHEN 5-325 MG PO TABS
1.0000 | ORAL_TABLET | ORAL | Status: DC | PRN
  Administered 2020-11-03 (×2): 1 via ORAL
  Filled 2020-11-02 (×2): qty 1

## 2020-11-02 MED ORDER — LACTATED RINGERS IV SOLN: INTRAVENOUS | Status: DC | PRN

## 2020-11-02 MED ORDER — NALOXONE HCL 4 MG/10ML IJ SOLN
1.0000 ug/kg/h | INTRAVENOUS | Status: DC | PRN
  Filled 2020-11-02: qty 5

## 2020-11-02 MED ORDER — MEPERIDINE HCL 25 MG/ML IJ SOLN: 6.2500 mg | INTRAMUSCULAR | Status: DC | PRN

## 2020-11-02 MED ORDER — DEXMEDETOMIDINE (PRECEDEX) IN NS 20 MCG/5ML (4 MCG/ML) IV SYRINGE
PREFILLED_SYRINGE | INTRAVENOUS | Status: AC
  Filled 2020-11-02: qty 5

## 2020-11-02 MED ORDER — FENTANYL CITRATE (PF) 100 MCG/2ML IJ SOLN
INTRAMUSCULAR | Status: AC
  Filled 2020-11-02: qty 2

## 2020-11-02 MED ORDER — HYDROMORPHONE HCL 1 MG/ML IJ SOLN: 0.2500 mg | INTRAMUSCULAR | Status: DC | PRN

## 2020-11-02 MED ORDER — ONDANSETRON HCL 4 MG/2ML IJ SOLN: 4.0000 mg | Freq: Once | INTRAMUSCULAR | Status: DC | PRN

## 2020-11-02 MED ORDER — DEXMEDETOMIDINE (PRECEDEX) IN NS 20 MCG/5ML (4 MCG/ML) IV SYRINGE
PREFILLED_SYRINGE | INTRAVENOUS | Status: DC | PRN
  Administered 2020-11-02: 4 ug via INTRAVENOUS

## 2020-11-02 MED ORDER — LACTATED RINGERS IV SOLN: INTRAVENOUS | Status: DC

## 2020-11-02 MED ORDER — ONDANSETRON HCL 4 MG/2ML IJ SOLN: 4.0000 mg | Freq: Three times a day (TID) | INTRAMUSCULAR | Status: DC | PRN

## 2020-11-02 MED ORDER — SODIUM BICARBONATE 8.4 % IV SOLN
INTRAVENOUS | Status: DC | PRN
  Administered 2020-11-02: 5 mL via EPIDURAL
  Administered 2020-11-02: 10 mL via EPIDURAL
  Administered 2020-11-02: 5 mL via EPIDURAL

## 2020-11-02 MED ORDER — DIPHENHYDRAMINE HCL 50 MG/ML IJ SOLN: 12.5000 mg | INTRAMUSCULAR | Status: DC | PRN

## 2020-11-02 MED ORDER — METOCLOPRAMIDE HCL 10 MG PO TABS
10.0000 mg | ORAL_TABLET | Freq: Once | ORAL | Status: AC
  Administered 2020-11-02: 10 mg via ORAL
  Filled 2020-11-02: qty 1

## 2020-11-02 MED ORDER — FENTANYL CITRATE (PF) 100 MCG/2ML IJ SOLN
INTRAMUSCULAR | Status: DC | PRN
  Administered 2020-11-02: 100 ug via EPIDURAL

## 2020-11-02 MED ORDER — MIDAZOLAM HCL 2 MG/2ML IJ SOLN
INTRAMUSCULAR | Status: AC
  Filled 2020-11-02: qty 2

## 2020-11-02 MED ORDER — ONDANSETRON HCL 4 MG/2ML IJ SOLN
INTRAMUSCULAR | Status: DC | PRN
  Administered 2020-11-02: 4 mg via INTRAVENOUS

## 2020-11-02 MED ORDER — NALOXONE HCL 0.4 MG/ML IJ SOLN: 0.4000 mg | INTRAMUSCULAR | Status: DC | PRN

## 2020-11-02 MED ORDER — STERILE WATER FOR IRRIGATION IR SOLN
Status: DC | PRN
  Administered 2020-11-02: 1

## 2020-11-02 MED ORDER — BUPIVACAINE HCL (PF) 0.5 % IJ SOLN
INTRAMUSCULAR | Status: AC
  Filled 2020-11-02: qty 30

## 2020-11-02 MED ORDER — NALBUPHINE HCL 10 MG/ML IJ SOLN: 5.0000 mg | Freq: Once | INTRAMUSCULAR | Status: DC | PRN

## 2020-11-02 SURGICAL SUPPLY — 27 items
CLOTH BEACON ORANGE TIMEOUT ST (SAFETY) ×2 IMPLANT
DRSG OPSITE POSTOP 3X4 (GAUZE/BANDAGES/DRESSINGS) ×2 IMPLANT
DURAPREP 26ML APPLICATOR (WOUND CARE) ×2 IMPLANT
ELECT REM PT RETURN 9FT ADLT (ELECTROSURGICAL) ×2
ELECTRODE REM PT RTRN 9FT ADLT (ELECTROSURGICAL) ×1 IMPLANT
GLOVE BIO SURGEON STRL SZ7 (GLOVE) ×2 IMPLANT
GLOVE BIOGEL PI IND STRL 7.0 (GLOVE) ×2 IMPLANT
GLOVE BIOGEL PI INDICATOR 7.0 (GLOVE) ×2
GOWN STRL REUS W/TWL LRG LVL3 (GOWN DISPOSABLE) ×4 IMPLANT
NEEDLE HYPO 22GX1.5 SAFETY (NEEDLE) ×2 IMPLANT
NS IRRIG 1000ML POUR BTL (IV SOLUTION) ×2 IMPLANT
PACK ABDOMINAL MINOR (CUSTOM PROCEDURE TRAY) ×2 IMPLANT
PENCIL BUTTON HOLSTER BLD 10FT (ELECTRODE) ×2 IMPLANT
PROTECTOR NERVE ULNAR (MISCELLANEOUS) ×2 IMPLANT
RETRACTOR WOUND ALXS 19CM XSML (INSTRUMENTS) ×1 IMPLANT
RTRCTR WOUND ALEXIS 19CM XSML (INSTRUMENTS) ×2
SPONGE LAP 4X18 RFD (DISPOSABLE) IMPLANT
STRIP CLOSURE SKIN 1/4X3 (GAUZE/BANDAGES/DRESSINGS) ×2 IMPLANT
SUT MNCRL AB 4-0 PS2 18 (SUTURE) ×2 IMPLANT
SUT PLAIN 0 NONE (SUTURE) ×2 IMPLANT
SUT VIC AB 0 CT1 27 (SUTURE) ×1
SUT VIC AB 0 CT1 27XBRD ANBCTR (SUTURE) ×1 IMPLANT
SUT VICRYL 0 UR6 27IN ABS (SUTURE) ×2 IMPLANT
SYR CONTROL 10ML LL (SYRINGE) ×2 IMPLANT
TOWEL OR 17X24 6PK STRL BLUE (TOWEL DISPOSABLE) ×4 IMPLANT
TRAY FOLEY CATH SILVER 14FR (SET/KITS/TRAYS/PACK) ×2 IMPLANT
WATER STERILE IRR 1000ML POUR (IV SOLUTION) ×2 IMPLANT

## 2020-11-02 NOTE — Transfer of Care (Signed)
Immediate Anesthesia Transfer of Care Note  Patient: Faith Woods  Procedure(s) Performed: POST PARTUM TUBAL LIGATION (N/A )  Patient Location: PACU  Anesthesia Type:Epidural  Level of Consciousness: awake, alert  and oriented  Airway & Oxygen Therapy: Patient Spontanous Breathing  Post-op Assessment: Report given to RN and Post -op Vital signs reviewed and stable  Post vital signs: Reviewed and stable  Last Vitals:  Vitals Value Taken Time  BP    Temp    Pulse    Resp    SpO2      Last Pain:  Vitals:   11/02/20 1646  TempSrc: Oral  PainSc:       Patients Stated Pain Goal: 3 (11/02/20 0423)  Complications: No complications documented.

## 2020-11-02 NOTE — Anesthesia Preprocedure Evaluation (Signed)
Anesthesia Evaluation  Patient identified by MRN, date of birth, ID band Patient awake    Reviewed: Patient's Chart, lab work & pertinent test results  Airway Mallampati: II  TM Distance: >3 FB Neck ROM: Full    Dental  (+) Teeth Intact   Pulmonary neg pulmonary ROS,    Pulmonary exam normal        Cardiovascular negative cardio ROS   Rhythm:Regular Rate:Normal     Neuro/Psych negative neurological ROS  negative psych ROS   GI/Hepatic negative GI ROS, Neg liver ROS,   Endo/Other  negative endocrine ROS  Renal/GU negative Renal ROS  negative genitourinary   Musculoskeletal negative musculoskeletal ROS (+)   Abdominal Normal abdominal exam  (+)  Abdomen: soft. Bowel sounds: normal.  Peds  Hematology negative hematology ROS (+)   Anesthesia Other Findings   Reproductive/Obstetrics negative OB ROS                             Anesthesia Physical Anesthesia Plan  ASA: II  Anesthesia Plan: Epidural   Post-op Pain Management:    Induction:   PONV Risk Score and Plan: 2 and Ondansetron and Dexamethasone  Airway Management Planned: Simple Face Mask, Nasal Cannula and Natural Airway  Additional Equipment: None  Intra-op Plan:   Post-operative Plan:   Informed Consent: I have reviewed the patients History and Physical, chart, labs and discussed the procedure including the risks, benefits and alternatives for the proposed anesthesia with the patient or authorized representative who has indicated his/her understanding and acceptance.     Dental advisory given  Plan Discussed with: CRNA  Anesthesia Plan Comments: (Lab Results      Component                Value               Date                      WBC                      10.6 (H)            11/02/2020                HGB                      11.4 (L)            11/02/2020                HCT                      35.2 (L)             11/02/2020                MCV                      84.4                11/02/2020                PLT                      174                 11/02/2020          )  Anesthesia Quick Evaluation  

## 2020-11-02 NOTE — Progress Notes (Addendum)
POSTPARTUM PROGRESS NOTE  Subjective: Faith Woods is a 24 y.o. [id number] PPD#1 s/p SVD at [redacted]w[redacted]d.  She reports she doing well. No acute events overnight. She denies any problems with ambulating, voiding or po intake. Denies nausea or vomiting. She has passed flatus. Pain is well controlled.  Lochia is very mild without presence of clots.  Objective: Blood pressure 122/86, pulse 90, temperature 98 F (36.7 C), temperature source Axillary, resp. rate 18, last menstrual period 01/14/2020, SpO2 100 %, unknown if currently breastfeeding.  Physical Exam:  General: alert, cooperative and no distress Chest: no respiratory distress Abdomen: soft, mild generalized tenderness on palpation Uterine Fundus: firm and at level of umbilicus Extremities: No calf swelling or tenderness, no LE edema noted bilaterally   Recent Labs    11/01/20 0425 11/02/20 0533  HGB 11.3* 11.4*  HCT 35.6* 35.2*    Assessment/Plan: Faith Woods is a 24 y.o. [id number] PPD#1 s/p SVD at [redacted]w[redacted]d for elective induction of labor.  Routine Postpartum Care: Doing well, pain well-controlled.  -- Continue routine care, lactation support  -- Contraception: BTL, scheduled for later today, patient NPO -- Feeding: both bottle and breast --Anemia: Patient has history of postpartum hemorrhage, TXA given during this delivery. Hgb stable at 11.4 from 11.3 yesterday. Continue PO iron supplementation every other day.  Dispo: Plan for discharge tomorrow (11/03/2020).  Reece Leader, DO 11/02/2020 7:21 AM   Attestation:  I confirm that I have verified the information documented in the resident's note and that I have also personally reperformed the physical exam and all medical decision making activities.   Patient was seen and examined by me also Agree with note Vitals stable Labs stable Fundus firm, lochia within normal limits Perineum healing Ext WNL Continue care Anticipate discharge tomorrow   Aviva Signs,  CNM

## 2020-11-02 NOTE — Anesthesia Postprocedure Evaluation (Signed)
Anesthesia Post Note  Patient: Faith Woods  Procedure(s) Performed: AN AD HOC LABOR EPIDURAL     Patient location during evaluation: Mother Baby Anesthesia Type: Epidural Level of consciousness: awake and alert Pain management: pain level controlled Vital Signs Assessment: post-procedure vital signs reviewed and stable Respiratory status: spontaneous breathing, nonlabored ventilation and respiratory function stable Cardiovascular status: stable Postop Assessment: no headache, no backache and epidural receding Anesthetic complications: no   No complications documented.  Last Vitals:  Vitals:   11/02/20 0209 11/02/20 0423  BP: 112/73 122/86  Pulse: 78 90  Resp: 18 18  Temp: 36.9 C 36.7 C  SpO2: 100% 100%    Last Pain:  Vitals:   11/02/20 0423  TempSrc: Axillary  PainSc: 4    Pain Goal: Patients Stated Pain Goal: 3 (11/02/20 0423)                 Fanny Dance

## 2020-11-02 NOTE — Lactation Note (Signed)
This note was copied from a baby's chart. Lactation Consultation Note  Patient Name: Faith Woods Today's Date: 11/02/2020   Mom is a P4 and her feeding intention is breast/formula. Per Reginia Forts, RN, Mom declines lactation at this time.  Lurline Hare [facility] 11/02/2020, 9:55 AM

## 2020-11-02 NOTE — Anesthesia Postprocedure Evaluation (Signed)
Anesthesia Post Note  Patient: Faith Woods  Procedure(s) Performed: POST PARTUM TUBAL LIGATION (N/A )     Patient location during evaluation: PACU Anesthesia Type: Epidural Level of consciousness: awake and alert Pain management: pain level controlled Vital Signs Assessment: post-procedure vital signs reviewed and stable Respiratory status: spontaneous breathing, nonlabored ventilation and respiratory function stable Cardiovascular status: stable Postop Assessment: no headache, no backache and epidural receding Anesthetic complications: no   No complications documented.  Last Vitals:  Vitals:   11/02/20 1935 11/02/20 1945  BP:  114/75  Pulse: 71 75  Resp: 19 18  Temp:    SpO2: 100% 100%    Last Pain:  Vitals:   11/02/20 1935  TempSrc:   PainSc: 0-No pain   Pain Goal: Patients Stated Pain Goal: 3 (11/02/20 0423)  LLE Motor Response: Purposeful movement (11/02/20 1935) LLE Sensation: Tingling (11/02/20 1935) RLE Motor Response: Purposeful movement (11/02/20 1935) RLE Sensation: Tingling (11/02/20 1935)     Epidural/Spinal Function Cutaneous sensation: Tingles (11/02/20 1935), Patient able to flex knees: Yes (11/02/20 1935), Patient able to lift hips off bed: No (11/02/20 1935), Back pain beyond tenderness at insertion site: No (11/02/20 1935), Progressively worsening motor and/or sensory loss: No (11/02/20 1935)  Averyana Pillars P Adelae Yodice

## 2020-11-03 ENCOUNTER — Encounter (HOSPITAL_COMMUNITY): Payer: Self-pay | Admitting: Obstetrics & Gynecology

## 2020-11-03 DIAGNOSIS — Z9851 Tubal ligation status: Secondary | ICD-10-CM

## 2020-11-03 DIAGNOSIS — Z302 Encounter for sterilization: Secondary | ICD-10-CM

## 2020-11-03 MED ORDER — ACETAMINOPHEN 325 MG PO TABS
650.0000 mg | ORAL_TABLET | ORAL | Status: DC | PRN
Start: 2020-11-03 — End: 2021-04-15

## 2020-11-03 MED ORDER — COCONUT OIL OIL
1.0000 | TOPICAL_OIL | 0 refills | Status: DC | PRN
Start: 2020-11-03 — End: 2021-04-15

## 2020-11-03 MED ORDER — IBUPROFEN 600 MG PO TABS: 600.0000 mg | ORAL_TABLET | Freq: Four times a day (QID) | ORAL | 0 refills | Status: AC

## 2020-11-03 NOTE — Discharge Instructions (Signed)

## 2020-11-03 NOTE — Op Note (Signed)
Delfina Adu-Acheampong 11/02/2020  PREOPERATIVE DIAGNOSES: Multiparity, undesired fertility  POSTOPERATIVE DIAGNOSES: Multiparity, undesired fertility  PROCEDURE:  Postpartum Bilateral Salpingectomy Bilateral Tubal Sterilization   SURGEON: Dr. Raynelle Dick  ANESTHESIA:  Epidural and local analgesia using 10 ml of 0.5% Marcaine  COMPLICATIONS:  None immediate.  ESTIMATED BLOOD LOSS: 10 ml.  INDICATIONS:  24 y.o. [id number] with undesired fertility, status post vaginal delivery, desires permanent sterilization.  Other reversible forms of contraception were discussed with patient; she declines all other modalities. Risks of procedure discussed with patient including but not limited to: risk of regret, permanence of method, bleeding, infection, injury to surrounding organs and need for additional procedures.  Discussed failure risk of 1% with increased risk of ectopic gestation if pregnancy occurs.  Also discussed possibility of post-tubal syndrome with increased pelvic pain or menstrual irregularities.  Patient verbalized understanding of these risks and wants to proceed with sterilization.  Written informed consent obtained.     FINDINGS:  Normal uterus, tubes, and ovaries. Excised fallopian tubes were sent to pathology.  PROCEDURE DETAILS: The patient was taken to the operating room where her epidural anesthesia was dosed up to surgical level and found to be adequate.  She was then placed in the dorsal supine position and prepped and draped in sterile fashion.   After an adequate timeout was performed, attention was turned to the patient's abdomen local analgesia was administered.  A small transverse skin incision was made under the umbilical fold. The incision was taken down to the layer of fascia using the scalpel, and fascia was incised, and extended bilaterally using Mayo scissors. The peritoneum was entered in a sharp fashion.  Attention was then turned to the patient's uterus, and left  fallopian tube was then identified, and the Babcock clamp was then used to grasp the tube. Ligasure were placed on the distal portion of the mesosalpinx underneath about 80-90% of the tube.  This pedicle was desiccated and resected.  The right fallopian tube was then identified and excised in a similar fashion with the Ligasure device allowing for bilateral salpingectomy.   Good hemostasis was noted overall.  The instruments were then removed from the patient's abdomen and the fascial incision was repaired with 0 Vicryl, and the skin was closed with a 4-0 Vicryl subcuticular stitch. The patient tolerated the procedure well.  Instrument, sponge, and needle counts were correct times three.  The patient was then taken to the recovery room awake and in stable condition.  Raynelle Dick, MD, FACOG Obstetrician & Gynecologist, [facility] for [facility], [facility]

## 2020-11-07 LAB — SURGICAL PATHOLOGY

## 2020-11-08 ENCOUNTER — Other Ambulatory Visit (HOSPITAL_COMMUNITY): Admission: RE | Admit: 2020-11-08 | Payer: No Typology Code available for payment source | Source: Ambulatory Visit

## 2020-11-08 ENCOUNTER — Encounter (HOSPITAL_COMMUNITY): Payer: Self-pay | Admitting: Obstetrics & Gynecology

## 2020-11-10 ENCOUNTER — Inpatient Hospital Stay (HOSPITAL_COMMUNITY)
Admission: AD | Admit: 2020-11-10 | Payer: Medicaid Other | Source: Home / Self Care | Admitting: Obstetrics & Gynecology

## 2020-11-10 ENCOUNTER — Inpatient Hospital Stay (HOSPITAL_COMMUNITY): Payer: Medicaid Other

## 2020-11-30 ENCOUNTER — Encounter: Payer: Self-pay | Admitting: Women's Health

## 2020-11-30 ENCOUNTER — Ambulatory Visit (INDEPENDENT_AMBULATORY_CARE_PROVIDER_SITE_OTHER): Payer: Medicaid Other | Admitting: Women's Health

## 2020-11-30 ENCOUNTER — Other Ambulatory Visit: Payer: Self-pay

## 2020-11-30 DIAGNOSIS — K5900 Constipation, unspecified: Secondary | ICD-10-CM

## 2020-11-30 DIAGNOSIS — O9963 Diseases of the digestive system complicating the puerperium: Secondary | ICD-10-CM

## 2020-11-30 DIAGNOSIS — K649 Unspecified hemorrhoids: Secondary | ICD-10-CM

## 2020-11-30 NOTE — Progress Notes (Signed)
..   Post Partum Visit Note  Faith Woods is a 24 y.o. [phone] female who presents for a postpartum visit. She is 4 weeks postpartum following a normal spontaneous vaginal delivery.  I have fully reviewed the prenatal and intrapartum course. The delivery was at 39.5 gestational weeks.  Anesthesia: epidural. Postpartum course has been good. Baby is doing well. Baby is feeding by both breast and bottle - Gerber. Bleeding no bleeding. Bowel function is abnormal-constipated. Bladder function is normal. Patient is not sexually active. Contraception method is tubal ligation. Postpartum depression screening: negative.   The pregnancy intention screening data noted above was reviewed. Potential methods of contraception were discussed. The patient elected to proceed with Female Sterilization.    Edinburgh Postnatal Depression Scale - 11/30/20 1451      Edinburgh Postnatal Depression Scale:  In the Past 7 Days   I have been able to laugh and see the funny side of things. 0    I have looked forward with enjoyment to things. 0    I have blamed myself unnecessarily when things went wrong. 0    I have been anxious or worried for no good reason. 0    I have felt scared or panicky for no good reason. 0    Things have been getting on top of me. 2    I have been so unhappy that I have had difficulty sleeping. 0    I have felt sad or miserable. 1    I have been so unhappy that I have been crying. 0    The thought of harming myself has occurred to me. 0    Edinburgh Postnatal Depression Scale Total 3            The following portions of the patient's history were reviewed and updated as appropriate: allergies, current medications, past family history, past medical history, past social history, past surgical history and problem list.  Review of Systems Pertinent items noted in HPI and remainder of comprehensive ROS otherwise negative.    Objective:  BP 102/66   Pulse 96   Ht 5\' 7"  (1.702 m)    Wt 212 lb 12.8 oz (96.5 kg)   LMP 01/14/2020 (Exact Date)   Breastfeeding Yes   BMI 33.33 kg/m    General:  alert, cooperative and no distress   Breasts:  declined  Lungs: clear to auscultation bilaterally  Heart:  regular rate and rhythm, S1, S2 normal, no murmur, click, rub or gallop  Abdomen: soft, non-tender; bowel sounds normal; no masses,  no organomegaly   Vulva:  normal  Vagina: not evaluated  Cervix:  not evaluated  Corpus: not examined  Adnexa:  not evaluated  Rectal Exam: external hemorrhoids        Assessment:    Normal postpartum exam. Pap smear not done at today's visit.   Plan:   Essential components of care per ACOG recommendations:  1.  Mood and well being: Patient with negative depression screening today. Reviewed local resources for support.  - Patient does not use tobacco. - hx of drug use? No   2. Infant care and feeding:  -Patient currently breastmilk feeding? Yes   3. Sexuality, contraception and birth spacing - Patient does not want a pregnancy in the next year.  Desired family size is 4 children.  - Reviewed forms of contraception in tiered fashion. Patient desired bilateral tubal ligation today.   - Discussed birth spacing of 18 months  4. Sleep and fatigue -Encouraged  family/partner/community support of 4 hrs of uninterrupted sleep to help with mood and fatigue  5. Physical Recovery  - Discussed patients delivery and complications - Patient has urinary incontinence? No - Patient is safe to resume physical and sexual activity  6.  Health Maintenance - Last pap smear done 09/2020 and was normal.  7. Chronic Disease - PCP follow up - referral to GI for external hemorrhoids - discussed measures to relieve constipation, pharmacologic and non-pharmacologic  Marylen Ponto, NP Center for [employer], [facility]

## 2020-11-30 NOTE — Patient Instructions (Addendum)
AREA FAMILY PRACTICE PHYSICIANS  [place] ([address]) . [facility] o [address] [place], [place] [address] o [phone] o Mon-Fri 8:30-12:30, 1:30-5:00 o Accepting Medicaid . [facility] o [address], [place], [place] [address] o [phone] o Mon-Fri 8:00-5:30 . [facility] o [address] [place], [place] [address] o [phone] o Mon, Tue, Thur, Fri 8:30-5:00, Wed 10:00-7:00 (closed 1-2pm) o Accepting Medicaid . [facility] o [address], [place], [place]  [address] o Phone - [phone]   Fax - [phone]  [place] ([address]) . [facility] o [address] [place], [place] [address] o [phone] o Mon-Fri 8:00-5:00 . [facility]  o [address] [place], [place] [address] o [phone] o Mon-Fri 8:30-5:30, Sat (Oct.-Mar.) 9:00-1:00 o Accepting Medicaid  [place] ([address]) . [facility] o [address], [place], [place] [address] o [phone] o Mon-Fri 8:00-5:00  [place] ([address]) . [facility] [facility] o [address], [place], [place] [address] o [phone] o Mon-Fri 8:00-5:00 . [facility] o [address], [place], [place] [address] o [phone] o Mon-Fri 8:00-5:00 . [facility] o [address] [place], [place] [address] o [phone] o Mon-Fri 8:00-5:00 . [facility] o [address] [place] [address] o [phone] o Mon-Fri 7:30-5:30  [place] ([address] & [address]) . [facility] o [address] [place], [place] [address] o [phone] o Mon-Thur 8:00-6:00 o Accepting Medicaid . [facility] o [address] [place], [place] [address] o [phone] o Mon-Thur 7:30-7:30, Fri 7:30-4:30 o Accepting Medicaid . [facility] o [address], [place], [place]  [address] o [phone]   Fax - [phone]  [place] ([address] & [address]) . [facility] o [address] [place], [place] [address] o [phone] o Mon-Fri 7:00-5:00 . [facility] o [address], [place], [place] [address] o [phone] o Mon-Fri 8:00-5:00 o Accepting Medicaid . [facility] o [address],  [place], [place] [address] o [phone] o Mon-Fri 8:00-5:00 o Accepting Medicaid  [place] ([address]) . [facility] [place] o [address] [place], [place] [address] o [phone] o Mon-Fri 8:00-5:00 . [facility] o [address], [place], [place] [address] o [phone] o Mon-Fri 8:00-5:00 o Accepting Medicaid . [facility] o [address], [place], [place] [address] o [phone] o Mon-Fri 8:00-5:30, Sat&Sun by appointment (phones open at 8:30) o Accepting Medicaid  [place] ([address]) . [facility] o [address] [place], [place] [address] o [phone] o Mon-Thur 8:00-7:00, Fri 8:00-5:00, Sat 8:00-12:00, Sun 9:00-12:00 o Accepting Medicaid . [facility] [place] o [address], [place], [place] [address] o [phone] o Mon-Thur 8:00-5:00 o Accepting Medicaid . [facility] o [address] [place],  [address] o [phone] o Mon-Fri 8:00-5:30, Sat (Oct.-Mar.) 9:00-1:00 o Accepting Medicaid  [place]  ([address]) .   [facility] o [address] [place], [place] [address] o [phone] o Mon-Fri 8:00-5:00 o Accepting Medicaid   [facility] [phone]) . [facility] o [address], [place], [place] [address] o [phone] o Mon-Fri 8:00-5:00 . Nature conservation officer at [facility] o [address], [place], [place] [address] o [phone] o Mon-Fri 8:00-5:00 . [facility] [place] o [address], [place], [place] [address] o [phone] o Mon-Fri 8:00-5:00 o After hours clinic [facility] [place], [place] [address]) [phone] Mon-Fri 5:00-8:00, Sat 12:00-6:00, Sun 10:00-4:00 o Accepting Medicaid . [facility] o [address] [place], [place]  [address] o [phone]   Fax - [phone]  [place] [phone]) . Nature conservation officer at [facility] o [address] [place], [place] [address] o [phone] o Mon-Fri 8:00-5:00 . [facility] [place] [place]) o [address], [place], [place] [address] o [phone] o Mon-Thur 8:00-7:00, Fri 8:00-5:00, Sat 8:00-12:00           Constipation, Adult Constipation is when a person has fewer bowel movements in a week than normal, has difficulty having a bowel movement, or has stools that are dry, hard, or larger than normal. Constipation may be caused by an underlying condition. It may become worse with age if a person takes certain medicines and does not take in enough fluids. Follow these instructions at home: Eating and drinking   Eat foods that have a lot of fiber, such as fresh fruits and vegetables, whole grains, and beans.  Limit foods that are high in fat, low in fiber, or overly processed, such as french fries, hamburgers, cookies, candies, and soda.  Drink enough fluid to keep your urine clear or pale yellow. General instructions  Exercise  regularly or as told by your health care provider.  Go to the restroom when you have the urge to go. Do not hold it in.  Take over-the-counter and prescription medicines only as told by your health care provider. These include any fiber supplements.  Practice pelvic floor retraining exercises, such as deep breathing while relaxing the lower abdomen and pelvic floor relaxation during bowel movements.  Watch your condition for any changes.  Keep all follow-up visits as told by your health care provider. This is important. Contact a health care provider if:  You have pain that gets worse.  You have a fever.  You do not have a bowel movement after 4 days.  You vomit.  You are not hungry.  You lose weight.  You are bleeding from the anus.  You have thin, pencil-like stools. Get help right away if:  You have a fever and your symptoms suddenly get worse.  You leak stool or have blood in your stool.  Your abdomen is bloated.  You have severe pain in your abdomen.  You feel dizzy or you faint. This information is not intended to replace advice given to you by your health care provider. Make sure you discuss any questions you have with your health care provider. Document Revised: 11/08/2017 Document Reviewed: 05/16/2016 Elsevier Patient Education  2020 [employer] Inc[employer]       Constipation: Colace Ducolax suppositories Fleet enema Glycerin suppositories Metamucil Milk of magnesia Miralax Senokot Smooth move tea  Hemorrhoids: Anusol Anusol HC Preparation H Tucks        Hemorrhoids Hemorrhoids are  swollen veins in and around the rectum or anus. There are two types of hemorrhoids:  Internal hemorrhoids. These occur in the veins that are just inside the rectum. They may poke through to the outside and become irritated and painful.  External hemorrhoids. These occur in the veins that are outside the anus and can be felt as a painful swelling or hard lump near  the anus. Most hemorrhoids do not cause serious problems, and they can be managed with home treatments such as diet and lifestyle changes. If home treatments do not help the symptoms, procedures can be done to shrink or remove the hemorrhoids. What are the causes? This condition is caused by increased pressure in the anal area. This pressure may result from various things, including:  Constipation.  Straining to have a bowel movement.  Diarrhea.  Pregnancy.  Obesity.  Sitting for long periods of time.  Heavy lifting or other activity that causes you to strain.  Anal sex.  Riding a bike for a long period of time. What are the signs or symptoms? Symptoms of this condition include:  Pain.  Anal itching or irritation.  Rectal bleeding.  Leakage of stool (feces).  Anal swelling.  One or more lumps around the anus. How is this diagnosed? This condition can often be diagnosed through a visual exam. Other exams or tests may also be done, such as:  An exam that involves feeling the rectal area with a gloved hand (digital rectal exam).  An exam of the anal canal that is done using a small tube (anoscope).  A blood test, if you have lost a significant amount of blood.  A test to look inside the colon using a flexible tube with a camera on the end (sigmoidoscopy or colonoscopy). How is this treated? This condition can usually be treated at home. However, various procedures may be done if dietary changes, lifestyle changes, and other home treatments do not help your symptoms. These procedures can help make the hemorrhoids smaller or remove them completely. Some of these procedures involve surgery, and others do not. Common procedures include:  Rubber band ligation. Rubber bands are placed at the base of the hemorrhoids to cut off their blood supply.  Sclerotherapy. Medicine is injected into the hemorrhoids to shrink them.  Infrared coagulation. A type of light energy is used  to get rid of the hemorrhoids.  Hemorrhoidectomy surgery. The hemorrhoids are surgically removed, and the veins that supply them are tied off.  Stapled hemorrhoidopexy surgery. The surgeon staples the base of the hemorrhoid to the rectal wall. Follow these instructions at home: Eating and drinking   Eat foods that have a lot of fiber in them, such as whole grains, beans, nuts, fruits, and vegetables.  Ask your health care provider about taking products that have added fiber (fiber supplements).  Reduce the amount of fat in your diet. You can do this by eating low-fat dairy products, eating less red meat, and avoiding processed foods.  Drink enough fluid to keep your urine pale yellow. Managing pain and swelling   Take warm sitz baths for 20 minutes, 3-4 times a day to ease pain and discomfort. You may do this in a bathtub or using a portable sitz bath that fits over the toilet.  If directed, apply ice to the affected area. Using ice packs between sitz baths may be helpful. ? Put ice in a plastic bag. ? Place a towel between your skin and the bag. ? Leave the ice  on for 20 minutes, 2-3 times a day. General instructions  Take over-the-counter and prescription medicines only as told by your health care provider.  Use medicated creams or suppositories as told.  Get regular exercise. Ask your health care provider how much and what kind of exercise is best for you. In general, you should do moderate exercise for at least 30 minutes on most days of the week (150 minutes each week). This can include activities such as walking, biking, or yoga.  Go to the bathroom when you have the urge to have a bowel movement. Do not wait.  Avoid straining to have bowel movements.  Keep the anal area dry and clean. Use wet toilet paper or moist towelettes after a bowel movement.  Do not sit on the toilet for long periods of time. This increases blood pooling and pain.  Keep all follow-up visits as  told by your health care provider. This is important. Contact a health care provider if you have:  Increasing pain and swelling that are not controlled by treatment or medicine.  Difficulty having a bowel movement, or you are unable to have a bowel movement.  Pain or inflammation outside the area of the hemorrhoids. Get help right away if you have:  Uncontrolled bleeding from your rectum. Summary  Hemorrhoids are swollen veins in and around the rectum or anus.  Most hemorrhoids can be managed with home treatments such as diet and lifestyle changes.  Taking warm sitz baths can help ease pain and discomfort.  In severe cases, procedures or surgery can be done to shrink or remove the hemorrhoids. This information is not intended to replace advice given to you by your health care provider. Make sure you discuss any questions you have with your health care provider. Document Revised: 04/24/2019 Document Reviewed: 04/17/2018 Elsevier Patient Education  2020 [employer].

## 2021-02-16 IMAGING — US US OB < 14 WEEKS - US OB TV
1 series · 15 of 28 positions shown · non-contrast
Comparison: None.

CLINICAL DATA: Pelvic pain.  Positive pregnancy test.

EXAM:
OBSTETRIC <14 WK US AND TRANSVAGINAL OB US
TECHNIQUE: Both transabdominal and transvaginal ultrasound examinations were
performed for complete evaluation of the gestation as well as the
maternal uterus, adnexal regions, and pelvic cul-de-sac.
Transvaginal technique was performed to assess early pregnancy.

[Series 1: us ob < 14 weeks - us ob tv · 15 of 83 slices shown]
[im 1/83]
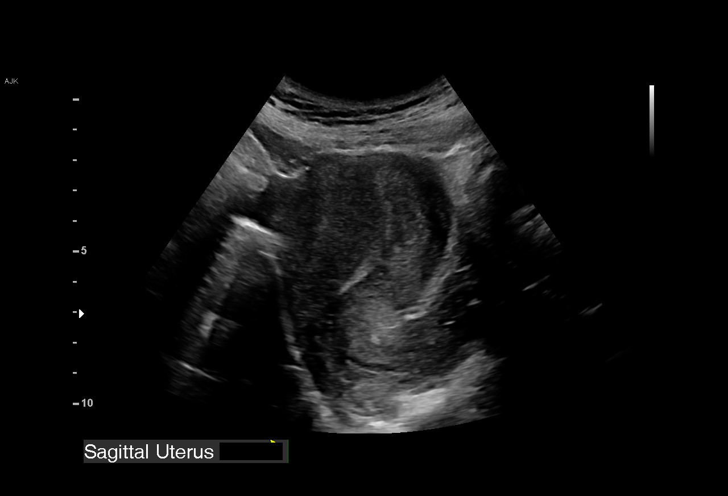
[im 7/83]
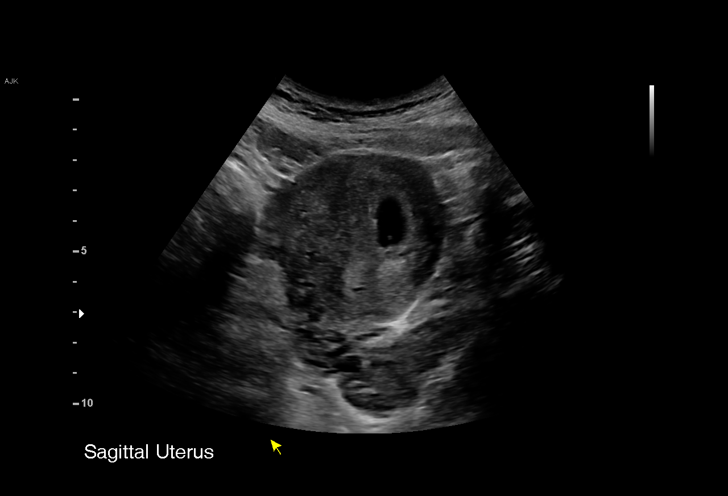
[im 13/83]
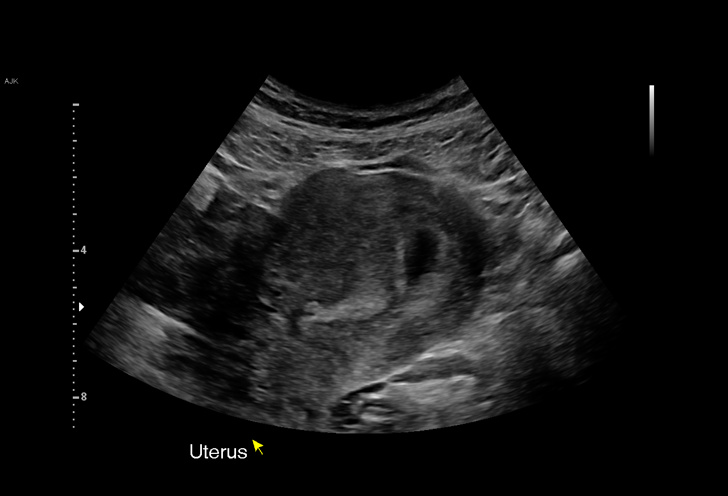
[im 19/83]
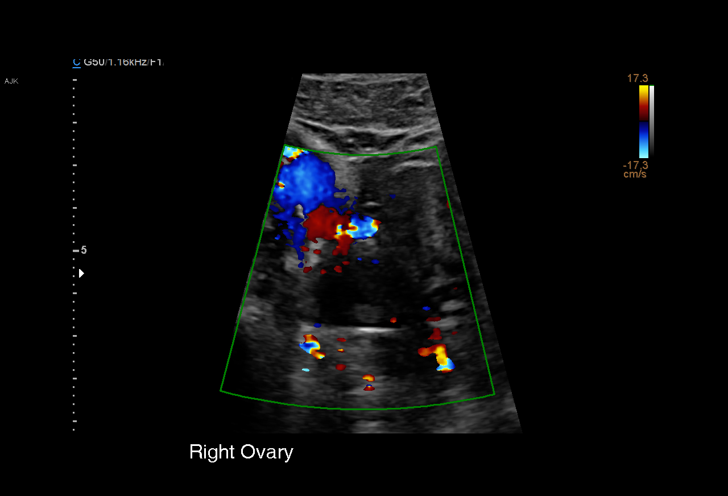
[im 25/83]
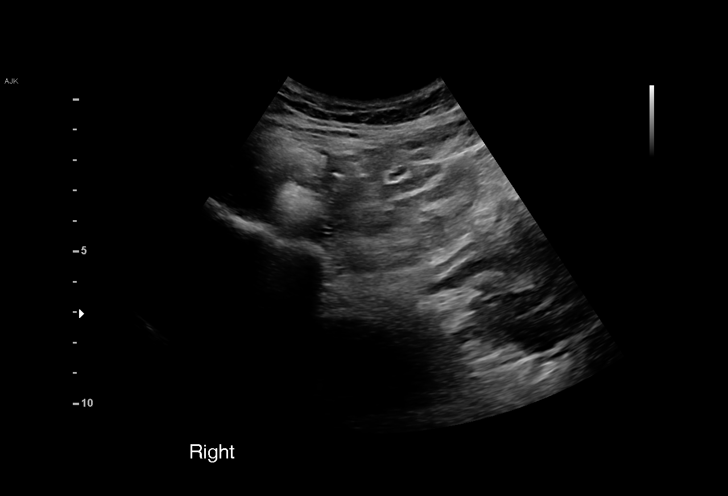
[im 31/83]
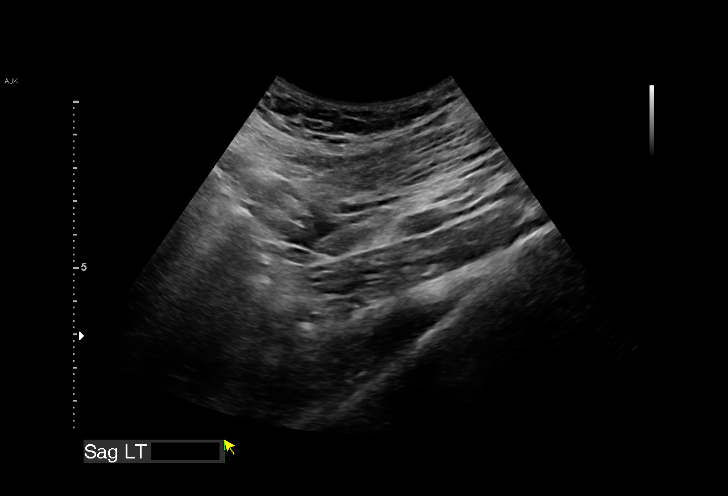
[im 37/83]
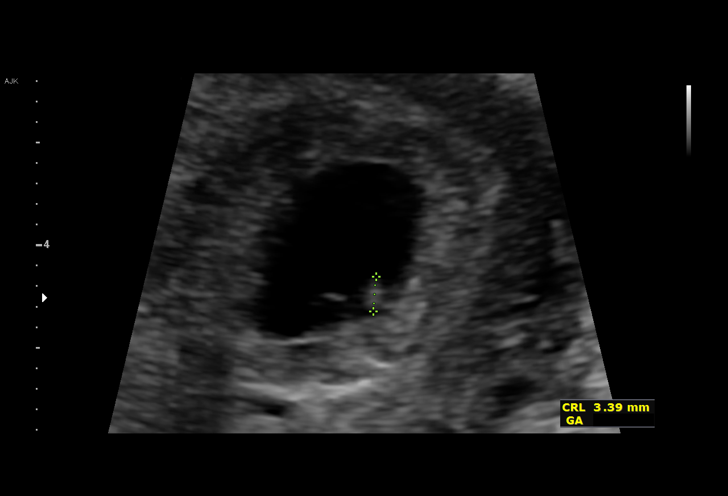
[im 43/83]
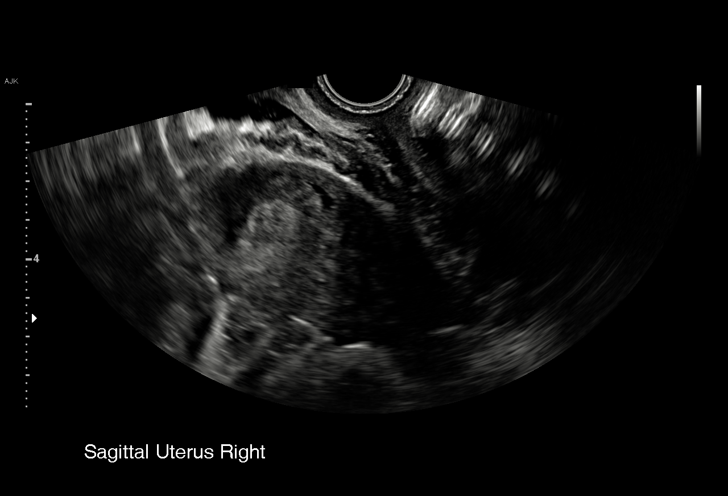
[im 46/83]
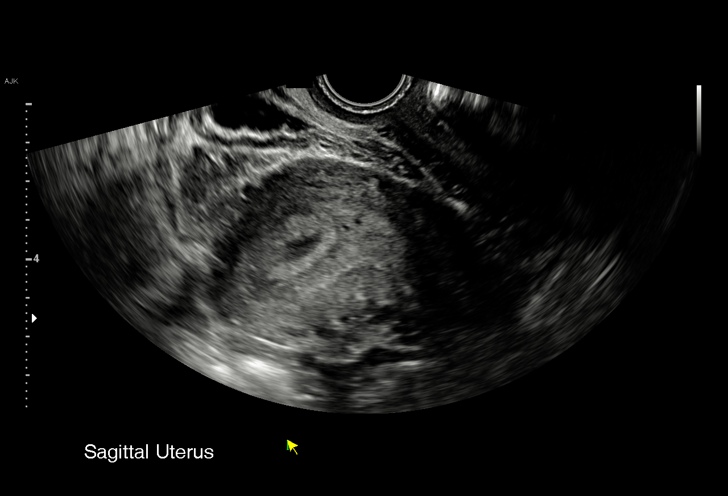
[im 52/83]
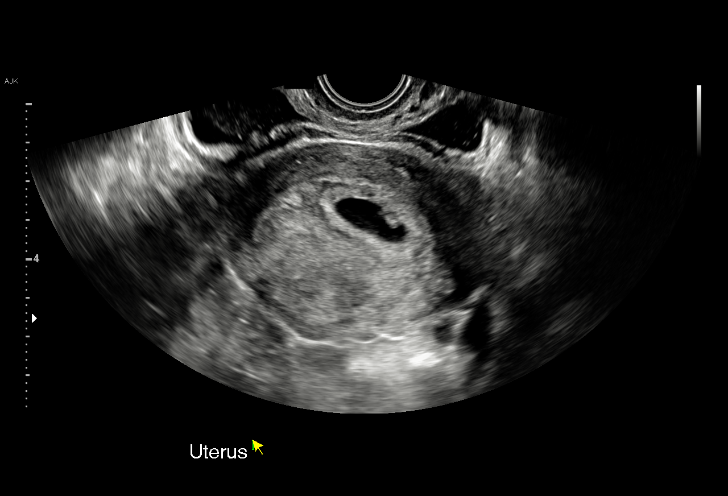
[im 58/83]
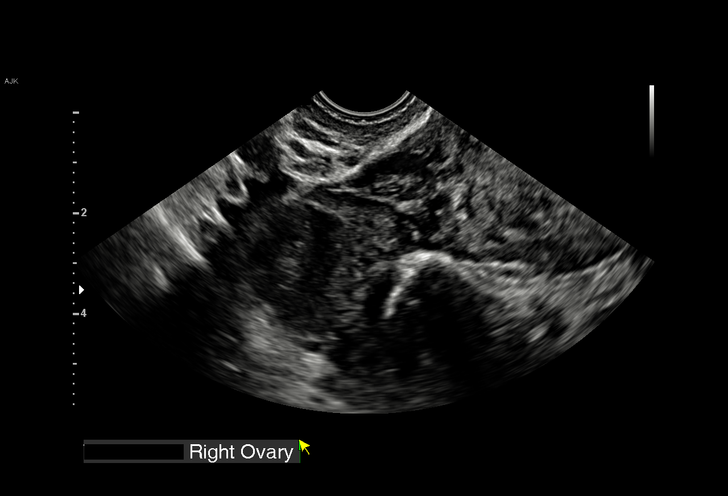
[im 64/83]
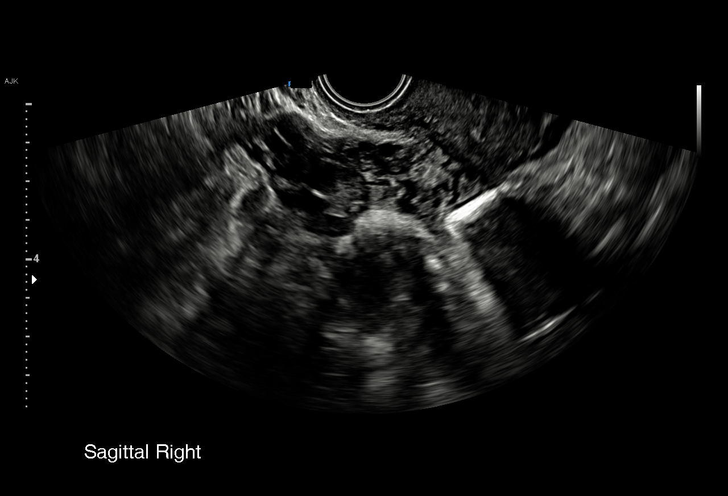
[im 70/83]
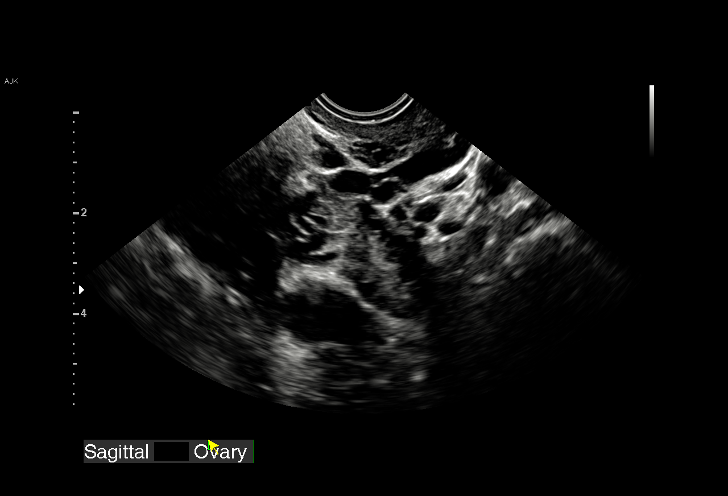
[im 76/83]
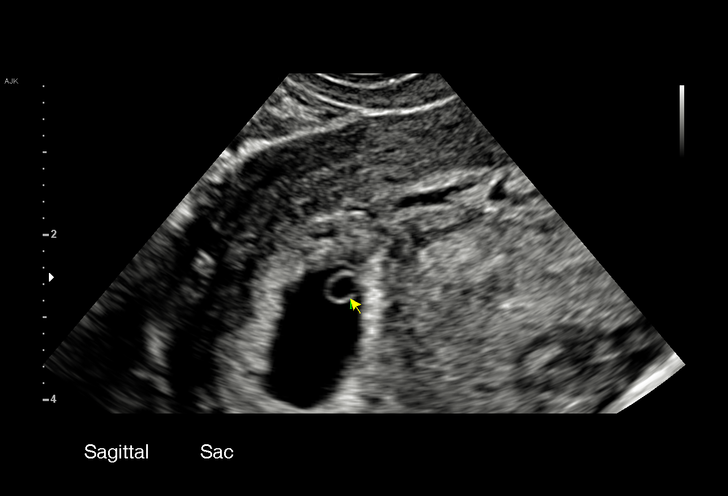
[im 83/83]
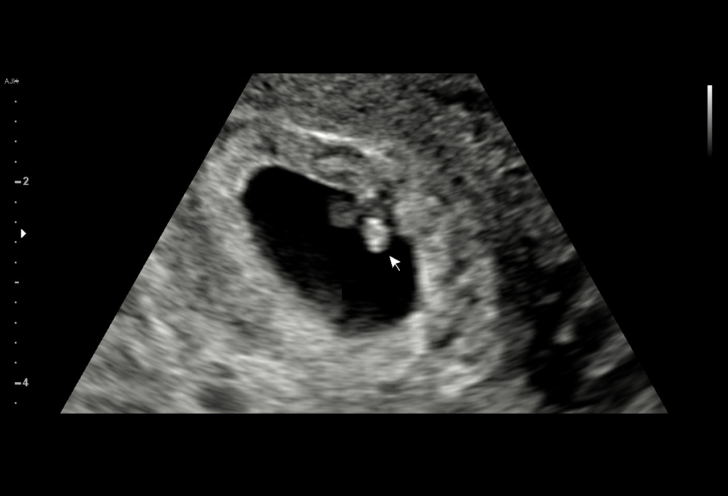

[15 of 28 positions shown; findings below may reference images not displayed]

FINDINGS: Intrauterine gestational sac: Present

Yolk sac:  Present

Embryo:  Present

Cardiac Activity: Present

Heart Rate: 117 bpm

CRL:  3.7 mm   6 w   0 d                  US EDC: 11/03/2020

Subchorionic hemorrhage:  None visualized.

Maternal uterus/adnexae: Normal ovaries. Corpus luteum cyst noted on
the right.
IMPRESSION: Living intrauterine embryo estimated at 6 weeks and 0 days
gestation.

No subchorionic hemorrhage.

Normal ovaries.  Corpus luteum cyst noted on the right.

## 2021-03-28 ENCOUNTER — Other Ambulatory Visit: Payer: Self-pay | Admitting: Women's Health

## 2021-03-28 DIAGNOSIS — K649 Unspecified hemorrhoids: Secondary | ICD-10-CM

## 2021-03-28 NOTE — Progress Notes (Signed)
ge

## 2021-04-14 ENCOUNTER — Emergency Department (HOSPITAL_COMMUNITY): Payer: Medicaid Other

## 2021-04-14 ENCOUNTER — Other Ambulatory Visit: Payer: Self-pay

## 2021-04-14 ENCOUNTER — Emergency Department (HOSPITAL_COMMUNITY)
Admission: EM | Admit: 2021-04-14 | Discharge: 2021-04-15 | Disposition: A | Discharge status: Home / Self Care | Payer: Medicaid Other | Attending: Emergency Medicine | Admitting: Emergency Medicine

## 2021-04-14 DIAGNOSIS — Z28310 Unvaccinated for covid-19: Secondary | ICD-10-CM | POA: Insufficient documentation

## 2021-04-14 DIAGNOSIS — U071 COVID-19: Secondary | ICD-10-CM | POA: Insufficient documentation

## 2021-04-14 DIAGNOSIS — R0981 Nasal congestion: Secondary | ICD-10-CM | POA: Insufficient documentation

## 2021-04-14 DIAGNOSIS — R509 Fever, unspecified: Secondary | ICD-10-CM | POA: Diagnosis present

## 2021-04-14 LAB — RESP PANEL BY RT-PCR (FLU A&B, COVID) ARPGX2
Influenza A by PCR: NEGATIVE
Influenza B by PCR: NEGATIVE
SARS Coronavirus 2 by RT PCR: POSITIVE — AB

## 2021-04-14 MED ORDER — ACETAMINOPHEN 500 MG PO TABS
1000.0000 mg | ORAL_TABLET | Freq: Once | ORAL | Status: AC
  Administered 2021-04-14: 1000 mg via ORAL
  Filled 2021-04-14: qty 2

## 2021-04-14 NOTE — ED Provider Notes (Signed)
Emergency Medicine Provider Triage Evaluation Note  Faith Woods , a 25 y.o. female  was evaluated in triage.  Pt complains of cough, URI, fever.  Symptoms started about a week ago but have been more severe over the past 3-4 days.  Last took Tylenol yesterday.  Has been having a productive cough and congestion.  Denies chest pain, abdominal pain or vomiting.  Unvaccinated for COVID, no known sick contacts.  Review of Systems  Positive: Fever, cough congestion Negative: Vomiting, abdominal pain, chest pain, shortness of breath  Physical Exam  BP 121/79 (BP Location: Right Arm)   Pulse (!) 109   Temp (!) 101.6 F (38.7 C) (Oral)   Resp (!) 22   LMP 03/25/2021 (Approximate)   SpO2 98%  Gen:   Awake, no distress   Resp:  Normal effort  MSK:   Moves extremities without difficulty  Other:    Medical Decision Making  Medically screening exam initiated at 6:26 PM.  Appropriate orders placed.  Faith Woods was informed that the remainder of the evaluation will be completed by another provider, this initial triage assessment does not replace that evaluation, and the importance of remaining in the ED until their evaluation is complete.     Dartha Lodge, PA-C 04/14/21 1827    Gerhard Munch, MD 04/14/21 [phone]

## 2021-04-14 NOTE — ED Triage Notes (Signed)
Pt reports productive cough, nasal congestion, headache, and fever x 1 week.

## 2021-04-15 ENCOUNTER — Other Ambulatory Visit: Payer: Self-pay | Admitting: Adult Health

## 2021-04-15 MED ORDER — ALBUTEROL SULFATE HFA 108 (90 BASE) MCG/ACT IN AERS
2.0000 | INHALATION_SPRAY | Freq: Once | RESPIRATORY_TRACT | Status: AC
  Administered 2021-04-15: 2 via RESPIRATORY_TRACT
  Filled 2021-04-15: qty 6.7

## 2021-04-15 MED ORDER — ONDANSETRON 4 MG PO TBDP: 4.0000 mg | ORAL_TABLET | Freq: Three times a day (TID) | ORAL | 0 refills | Status: DC | PRN

## 2021-04-15 MED ORDER — ONDANSETRON 4 MG PO TBDP: 4.0000 mg | ORAL_TABLET | Freq: Three times a day (TID) | ORAL | 0 refills | Status: AC | PRN

## 2021-04-15 MED ORDER — AEROCHAMBER PLUS FLO-VU LARGE MISC
1.0000 | Freq: Once | Status: AC
  Administered 2021-04-15: 1

## 2021-04-15 NOTE — ED Provider Notes (Signed)
[facility] EMERGENCY DEPARTMENT Provider Note   CSN: [id number] Arrival date & time: 04/14/21  1811     History No chief complaint on file.   Faith Woods is a 25 y.o. female with a history of ischemic bowel syndrome who presents to the emergency department with a chief complaint of fever, nasal congestion, myalgias, intermittently productive cough that began on 5/2, but worsened the following day.  Symptoms have persisted, but but today she became dizzy and lightheaded with ambulation, which prompted her visit to the ER.  She reports intermittent shortness of breath brought on by exertion.  No chest pain.  She is having nausea, but no vomiting or diarrhea, abdominal pain, neck stiffness, confusion.  She has been treating her fever with Tylenol with good improvement.  She reports that her 76-month-old daughter as well as her child that attends pre-k have been ill over the last week.  She is unvaccinated against COVID-19.  No other treatment prior to arrival.  The history is provided by the patient and medical records. No language interpreter was used.       Past Medical History:  Diagnosis Date  . 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 H/O postpartum hemorrhage, currently pregnant 07/22/2020  . History of preterm delivery, currently pregnant 11/18/2018   Preterm labor at 30 weeks Patient did not receive 17-P with second pregnancy Patient declines 17-P this pregnancy  . Ischemic bowel syndrome (HCC)   . Medical history non-contributory   . Supervision of high risk pregnancy, antepartum 04/12/2020    Nursing Staff Provider Office Location  [place] Dating  LMP Language   ENGLISH Anatomy US   normal Flu Vaccine  Declined 10/12 Genetic Screen  NIPS: low risks  AFP:   First Screen:  Quad:   TDaP vaccine   Declined 10/12 Hgb A1C or  GTT Early  Third trimester nl 2 hour Rhogam      LAB RESULTS    Blood Type O/Positive/-- (08/30 1004) O pos Feeding Plan  BOTTLE FEED Antibody Negative (08/30 1004) Cont    Patient Active Problem List   Diagnosis Date Noted  . History of bilateral tubal ligation 11/03/2020    Past Surgical History:  Procedure Laterality Date  . NO PAST SURGERIES    . TUBAL LIGATION N/A 11/02/2020   Procedure: POST PARTUM TUBAL LIGATION;  Surgeon: Malachy Chamber, MD;  Location: [facility] LD ORS;  Service: Gynecology;  Laterality: N/A;     OB History    Gravida  4   Para  4   Term  3   Preterm  1   AB      Living  4     SAB      IAB      Ectopic      Multiple  0   Live Births  4           Family History  Problem Relation Age of Onset  . Healthy Mother   . Healthy Father   . Hypertension Father   . Diabetes Paternal Grandfather     Social History   Tobacco Use  . Smoking status: Never Smoker  . Smokeless tobacco: Never Used  Vaping Use  . Vaping Use: Never used  Substance Use Topics  . Alcohol use: No  . Drug use: No    Home Medications Prior to Admission medications  Medication Sig Start Date End Date Taking? Authorizing Provider  ibuprofen (ADVIL) 600 MG tablet Take 1 tablet (600 mg total) by mouth every 6 (six) hours. 11/03/20   Alric Seton, MD  ondansetron (ZOFRAN ODT) 4 MG disintegrating tablet Take 1 tablet (4 mg total) by mouth every 8 (eight) hours as needed. 04/15/21   Akeelah Seppala A, PA-C    Allergies    Patient has no known allergies.  Review of Systems   Review of Systems  Constitutional: Positive for chills and fever. Negative for activity change.  HENT: Positive for congestion. Negative for sinus pressure, sinus pain and sore throat.   Eyes: Negative for visual disturbance.  Respiratory: Positive for cough and shortness of breath.   Cardiovascular: Negative for chest pain.  Gastrointestinal: Negative for abdominal pain, constipation, diarrhea, nausea and vomiting.  Genitourinary:  Negative for dysuria.  Musculoskeletal: Positive for myalgias. Negative for back pain.  Skin: Negative for rash.  Allergic/Immunologic: Negative for immunocompromised state.  Neurological: Positive for light-headedness. Negative for headaches.  Psychiatric/Behavioral: Negative for confusion.    Physical Exam Updated Vital Signs BP 132/78 (BP Location: Left Arm)   Pulse 89   Temp 99.1 F (37.3 C) (Oral)   Resp (!) 22   LMP 03/25/2021 (Approximate)   SpO2 99%   Physical Exam Vitals and nursing note reviewed.  Constitutional:      General: She is not in acute distress.    Appearance: She is not ill-appearing, toxic-appearing or diaphoretic.  HENT:     Head: Normocephalic.     Right Ear: Tympanic membrane, ear canal and external ear normal.     Left Ear: Tympanic membrane, ear canal and external ear normal.     Nose: Congestion present.     Mouth/Throat:     Mouth: Mucous membranes are moist.     Pharynx: No oropharyngeal exudate or posterior oropharyngeal erythema.  Eyes:     Conjunctiva/sclera: Conjunctivae normal.  Cardiovascular:     Rate and Rhythm: Normal rate and regular rhythm.     Heart sounds: No murmur heard. No friction rub. No gallop.   Pulmonary:     Effort: Pulmonary effort is normal. No respiratory distress.     Breath sounds: No stridor. No wheezing, rhonchi or rales.     Comments: Lungs are clear to auscultation bilaterally.  No increased work of breathing. Chest:     Chest wall: No tenderness.  Abdominal:     General: There is no distension.     Palpations: Abdomen is soft. There is no mass.     Tenderness: There is no abdominal tenderness. There is no right CVA tenderness, left CVA tenderness, guarding or rebound.     Hernia: No hernia is present.  Musculoskeletal:     Cervical back: Neck supple.     Right lower leg: No edema.     Left lower leg: No edema.  Skin:    General: Skin is warm.     Findings: No rash.  Neurological:     Mental Status:  She is alert.  Psychiatric:        Behavior: Behavior normal.     ED Results / Procedures / Treatments   Labs (all labs ordered are listed, but only abnormal results are displayed) Labs Reviewed  RESP PANEL BY RT-PCR (FLU A&B, COVID) ARPGX2 - Abnormal; Notable for the following components:      Result Value   SARS Coronavirus 2 by RT PCR POSITIVE (*)  All other components within normal limits    EKG None  Radiology DG Chest Portable 1 View  Result Date: 04/14/2021 CLINICAL DATA:  Cough and fever. EXAM: PORTABLE CHEST 1 VIEW COMPARISON:  None. FINDINGS: The heart size and mediastinal contours are within normal limits. Both lungs are clear. The visualized skeletal structures are unremarkable. IMPRESSION: No active disease. Electronically Signed   By: Aram Candela M.D.   On: 04/14/2021 20:04    Procedures Procedures   Medications Ordered in ED Medications  acetaminophen (TYLENOL) tablet 1,000 mg (1,000 mg Oral Given 04/14/21 1823)  albuterol (VENTOLIN HFA) 108 (90 Base) MCG/ACT inhaler 2 puff (2 puffs Inhalation Given 04/15/21 0152)  AeroChamber Plus Flo-Vu Large MISC 1 each (1 each Other Given 04/15/21 0153)    ED Course  I have reviewed the triage vital signs and the nursing notes.  Pertinent labs & imaging results that were available during my care of the patient were reviewed by me and considered in my medical decision making (see chart for details).    MDM Rules/Calculators/A&P                          25 year old female with history of ischemic bowel syndrome presenting with URI symptoms, onset this week.  Febrile on arrival to the ER that resolved with Tylenol.  Vital signs are otherwise unremarkable.  Physical exam is overall reassuring.  Labs and imaging of been reviewed and independently interpreted by me.  Patient tested positive for COVID-19.  Patient was ambulated on pulse ox by me in the room and maintained oxygen saturation 98 to 99% on room air.  Discussed  home quarantine and isolation precautions.  Discussed CDC guidelines.  We will discharged home with symptomatic care.  Referral has been placed for outpatient IV antiviral therapy.  At this time, patient is hemodynamically stable no acute distress.  Doubt meningitis, PE, ACS intra-abdominal infection.  She is hemodynamically stable no acute distress.  Safe for discharge home with outpatient follow-up as needed.  Final Clinical Impression(s) / ED Diagnoses Final diagnoses:  COVID-19    Rx / DC Orders ED Discharge Orders         Ordered    ondansetron (ZOFRAN ODT) 4 MG disintegrating tablet  Every 8 hours PRN,   Status:  Discontinued        04/15/21 0147    ondansetron (ZOFRAN ODT) 4 MG disintegrating tablet  Every 8 hours PRN        04/15/21 0147           Merrissa Giacobbe A, PA-C 04/15/21 0818    Gilda Crease, MD 04/16/21 0126

## 2021-04-15 NOTE — Discharge Instructions (Addendum)
Thank you for allowing me to care for you today in the Emergency Department.   You tested positive for COVID-19 today.  You need to quarantine at home for 5 days after your symptoms began so until 5/7. If you leave the home after that time you need to ensure you are wearing a mask at all times until after May 12 if you cannot stay home all the time as your symptoms are contagious.  At home, you should try to quarantine in another room to avoid getting other members of your family sick.  Additional information is included on your discharge paperwork.  I have referred you to the COVID infusion clinic for treatment.  They may give you a call on your home phone for further information.  To manage her symptoms at home:  Take 650 mg of Tylenol or 600 mg of ibuprofen with food every 6 hours for pain, headache, or fever.  You can alternate between these 2 medications every 3 hours if your pain returns.  For instance, you can take Tylenol at noon, followed by a dose of ibuprofen at 3, followed by second dose of Tylenol and 6.  Let 1 tablet of Zofran dissolve under tongue every 8 hours as needed for nausea or vomiting.  You can use 2 puffs of the albuterol inhaler with a spacer every 4 hours as needed for chest tightness or shortness of breath.  You can call the number on the discharge paperwork to get established with a primary care provider.  Return to the emergency department if you pass out, if you develop respiratory distress, if your fingers or lips turn blue, you develop uncontrollable vomiting despite taking Zofran, or have other new, concerning symptoms.

## 2021-04-15 NOTE — ED Notes (Addendum)
RN into oom. Vitals assessed given drink per request. Updated on plan of care. Pt ambulated w/ o2 sat remaining 100%. Denied SOB. Bed low and locked. Call bell in reach.

## 2021-04-15 NOTE — ED Notes (Signed)
Medications follow up appts reviewed w/ pt. Denies questions or concerns @ this time. Education on s/s of worsening and when to return. Evaluated for SOB/cough. Found to be Covid+. Education given on tx. Educated on use of albuterol inhaler and spacer. A/0 x4, Verbalized understanding.    Left w/ even and steady gait. NAD noted.VSS.

## 2021-04-15 NOTE — ED Notes (Signed)
Pt walked out and informed this RN "she wanted to check out." Made aware that pt would be leaving against medical advice and that was waiting to see provider. RN apologized for delay and pt agrees to stay @ this time.

## 2021-04-15 NOTE — Progress Notes (Signed)
Received referral for Faith Woods to receive treatment for COVID 19.  There is limited availability for IV treatment, and she will be outside of the window of benefit by the time we could accommodate her.  Her symptoms are improving, and they began on 04/10/2021.  She has not had a recent BMP so I am unable to prescribe Paxlovid, which would be safe for her to take while breastfeeding.  She does not want to take Molnupiravir because it requires her to stop breast feeding while taking it, and four days later.    After a long discussion of her options, she is going to continue supportive care she was recommended from her ER visit overnight.    Faith Anes, NP

## 2021-05-13 ENCOUNTER — Other Ambulatory Visit: Payer: Self-pay

## 2021-05-13 ENCOUNTER — Emergency Department (HOSPITAL_COMMUNITY)
Admission: EM | Admit: 2021-05-13 | Discharge: 2021-05-13 | Disposition: A | Discharge status: Home / Self Care | Payer: Medicaid Other | Attending: Emergency Medicine | Admitting: Emergency Medicine

## 2021-05-13 ENCOUNTER — Emergency Department (HOSPITAL_COMMUNITY): Payer: Medicaid Other

## 2021-05-13 DIAGNOSIS — R110 Nausea: Secondary | ICD-10-CM | POA: Diagnosis not present

## 2021-05-13 DIAGNOSIS — R109 Unspecified abdominal pain: Secondary | ICD-10-CM | POA: Insufficient documentation

## 2021-05-13 LAB — PREGNANCY, URINE: Preg Test, Ur: NEGATIVE

## 2021-05-13 LAB — URINALYSIS, ROUTINE W REFLEX MICROSCOPIC
Bilirubin Urine: NEGATIVE
Glucose, UA: NEGATIVE mg/dL
Hgb urine dipstick: NEGATIVE
Ketones, ur: NEGATIVE mg/dL
Leukocytes,Ua: NEGATIVE
Nitrite: NEGATIVE
Protein, ur: NEGATIVE mg/dL
Specific Gravity, Urine: 1.017 (ref 1.005–1.030)
pH: 5 (ref 5.0–8.0)

## 2021-05-13 MED ORDER — KETOROLAC TROMETHAMINE 30 MG/ML IJ SOLN
60.0000 mg | Freq: Once | INTRAMUSCULAR | Status: AC
  Administered 2021-05-13: 60 mg via INTRAMUSCULAR
  Filled 2021-05-13: qty 2

## 2021-05-13 MED ORDER — IBUPROFEN 600 MG PO TABS: 600.0000 mg | ORAL_TABLET | Freq: Four times a day (QID) | ORAL | 0 refills | Status: AC | PRN

## 2021-05-13 NOTE — ED Provider Notes (Signed)
[facility]-EMERGENCY DEPT Provider Note   CSN: [id number] Arrival date & time: 05/13/21  1512     History Chief Complaint  Patient presents with  . Flank Pain    Faith Woods is a 25 y.o. female.  25 year old female presents with flank pain x4 days.  Pain is worse with certain movements.  Denies any hematuria or dysuria.  No fever or chills.  Some nausea but no vomiting.  Pain does not radiate down her legs.  No prior history of same        Past Medical History:  Diagnosis Date  . 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 H/O postpartum hemorrhage, currently pregnant 07/22/2020  . History of preterm delivery, currently pregnant 11/18/2018   Preterm labor at 30 weeks Patient did not receive 17-P with second pregnancy Patient declines 17-P this pregnancy  . Ischemic bowel syndrome (HCC)   . Medical history non-contributory   . Supervision of high risk pregnancy, antepartum 04/12/2020    Nursing Staff Provider Office Location  [place] Dating  LMP Language   ENGLISH Anatomy US   normal Flu Vaccine  Declined 10/12 Genetic Screen  NIPS: low risks  AFP:   First Screen:  Quad:   TDaP vaccine   Declined 10/12 Hgb A1C or  GTT Early  Third trimester nl 2 hour Rhogam     LAB RESULTS    Blood Type O/Positive/-- (08/30 1004) O pos Feeding Plan  BOTTLE FEED Antibody Negative (08/30 1004) Cont    Patient Active Problem List   Diagnosis Date Noted  . History of bilateral tubal ligation 11/03/2020    Past Surgical History:  Procedure Laterality Date  . NO PAST SURGERIES    . TUBAL LIGATION N/A 11/02/2020   Procedure: POST PARTUM TUBAL LIGATION;  Surgeon: Malachy Chamber, MD;  Location: [facility] LD ORS;  Service: Gynecology;  Laterality: N/A;     OB History    Gravida  4   Para  4   Term  3   Preterm  1   AB      Living  4     SAB      IAB      Ectopic       Multiple  0   Live Births  4           Family History  Problem Relation Age of Onset  . Healthy Mother   . Healthy Father   . Hypertension Father   . Diabetes Paternal Grandfather     Social History   Tobacco Use  . Smoking status: Never Smoker  . Smokeless tobacco: Never Used  Vaping Use  . Vaping Use: Never used  Substance Use Topics  . Alcohol use: No  . Drug use: No    Home Medications Prior to Admission medications   Medication Sig Start Date End Date Taking? Authorizing Provider  ibuprofen (ADVIL) 600 MG tablet Take 1 tablet (600 mg total) by mouth every 6 (six) hours. 11/03/20   Alric Seton, MD  ondansetron (ZOFRAN ODT) 4 MG disintegrating tablet Take 1 tablet (4 mg total) by mouth every 8 (eight) hours as needed. 04/15/21   McDonald, Mia A, PA-C    Allergies    Patient has no known allergies.  Review of Systems   Review of Systems  All other systems reviewed and are negative.  Physical Exam Updated Vital Signs BP 135/85 (BP Location: Left Arm)   Pulse 78   Temp 98.3 F (36.8 C) (Oral)   Resp 16   Ht 1.727 m (5\' 8" )   Wt 102.1 kg   LMP 05/08/2021   SpO2 100%   BMI 34.21 kg/m   Physical Exam Vitals and nursing note reviewed.  Constitutional:      General: She is not in acute distress.    Appearance: Normal appearance. She is well-developed. She is not toxic-appearing.  HENT:     Head: Normocephalic and atraumatic.  Eyes:     General: Lids are normal.     Conjunctiva/sclera: Conjunctivae normal.     Pupils: Pupils are equal, round, and reactive to light.  Neck:     Thyroid: No thyroid mass.     Trachea: No tracheal deviation.  Cardiovascular:     Rate and Rhythm: Normal rate and regular rhythm.     Heart sounds: Normal heart sounds. No murmur heard. No gallop.   Pulmonary:     Effort: Pulmonary effort is normal. No respiratory distress.     Breath sounds: Normal breath sounds. No stridor. No decreased breath sounds,  wheezing, rhonchi or rales.  Abdominal:     General: Bowel sounds are normal. There is no distension.     Palpations: Abdomen is soft.     Tenderness: There is no abdominal tenderness. There is no rebound.  Musculoskeletal:        General: No tenderness. Normal range of motion.     Cervical back: Normal range of motion and neck supple.  Skin:    General: Skin is warm and dry.     Findings: No abrasion or rash.  Neurological:     Mental Status: She is alert and oriented to person, place, and time.     GCS: GCS eye subscore is 4. GCS verbal subscore is 5. GCS motor subscore is 6.     Cranial Nerves: No cranial nerve deficit.     Sensory: No sensory deficit.  Psychiatric:        Speech: Speech normal.        Behavior: Behavior normal.     ED Results / Procedures / Treatments   Labs (all labs ordered are listed, but only abnormal results are displayed) Labs Reviewed  URINALYSIS, ROUTINE W REFLEX MICROSCOPIC - Abnormal; Notable for the following components:      Result Value   APPearance HAZY (*)    All other components within normal limits  PREGNANCY, URINE    EKG None  Radiology No results found.  Procedures Procedures   Medications Ordered in ED Medications  ketorolac (TORADOL) 30 MG/ML injection 60 mg (has no administration in time range)    ED Course  I have reviewed the triage vital signs and the nursing notes.  Pertinent labs & imaging results that were available during my care of the patient were reviewed by me and considered in my medical decision making (see chart for details).    MDM Rules/Calculators/A&P                          Urinalysis negative here.  Patient is not pregnant.  Renal CT negative.  Given Toradol feels better.  Will prescribe anti-inflammatories and muscle relaxants and discharge Final Clinical Impression(s) / ED Diagnoses Final diagnoses:  None    Rx / DC Orders ED Discharge Orders    None  Lorre Nick, MD 05/13/21  2036

## 2021-05-13 NOTE — ED Triage Notes (Signed)
Patient reports bilateral flank pain x1 week. History of kidney stones. Pain rated 9/10

## 2021-05-13 NOTE — ED Provider Notes (Signed)
Emergency Medicine Provider Triage Evaluation Note  Faith Woods , a 25 y.o. female  was evaluated in triage.  Pt complains of 1 week history of bilateral flank pain.  Reports nausea but denies vomiting.  Has been having some dysuria and is unsure if she passed a kidney stone..  Review of Systems  Positive: Flank pain, nausea Negative: Fever vomiting  Physical Exam  BP 137/83 (BP Location: Left Arm)   Pulse 73   Temp 98.3 F (36.8 C) (Oral)   Resp 18   Ht 5\' 8"  (1.727 m)   Wt 102.1 kg   LMP 05/08/2021   SpO2 100%   BMI 34.21 kg/m  Gen:   Awake, no distress   Resp:  Normal effort  MSK:   Moves extremities without difficulty  Other:  Bilateral flank tenderness  Medical Decision Making  Medically screening exam initiated at 4:19 PM.  Appropriate orders placed.  Faith Woods was informed that the remainder of the evaluation will be completed by another provider, this initial triage assessment does not replace that evaluation, and the importance of remaining in the ED until their evaluation is complete.  Urinalysis and urine pregnancy test ordered   05/10/2021, PA-C 05/13/21 1619    07/13/21, MD 05/14/21 [id number]

## 2021-07-29 ENCOUNTER — Ambulatory Visit (HOSPITAL_COMMUNITY)
Admission: EM | Admit: 2021-07-29 | Discharge: 2021-07-29 | Discharge status: Against Medical Advice | Payer: Medicaid Other

## 2021-07-29 ENCOUNTER — Other Ambulatory Visit: Payer: Self-pay

## 2021-07-30 ENCOUNTER — Ambulatory Visit (HOSPITAL_COMMUNITY)
Admission: EM | Admit: 2021-07-30 | Discharge: 2021-07-30 | Disposition: A | Discharge status: Home / Self Care | Payer: Medicaid Other | Attending: Emergency Medicine | Admitting: Emergency Medicine

## 2021-07-30 ENCOUNTER — Encounter (HOSPITAL_COMMUNITY): Payer: Self-pay

## 2021-07-30 VITALS — BP 102/64 | HR 72 | Temp 98.6°F | Resp 17

## 2021-07-30 DIAGNOSIS — N939 Abnormal uterine and vaginal bleeding, unspecified: Secondary | ICD-10-CM | POA: Diagnosis not present

## 2021-07-30 DIAGNOSIS — R300 Dysuria: Secondary | ICD-10-CM | POA: Diagnosis not present

## 2021-07-30 LAB — POCT URINALYSIS DIPSTICK, ED / UC
Bilirubin Urine: NEGATIVE
Glucose, UA: NEGATIVE mg/dL
Hgb urine dipstick: NEGATIVE
Leukocytes,Ua: NEGATIVE
Nitrite: NEGATIVE
Protein, ur: NEGATIVE mg/dL
Specific Gravity, Urine: 1.025 (ref 1.005–1.030)
Urobilinogen, UA: 4 mg/dL — ABNORMAL HIGH (ref 0.0–1.0)
pH: 6.5 (ref 5.0–8.0)

## 2021-07-30 LAB — POC URINE PREG, ED: Preg Test, Ur: NEGATIVE

## 2021-07-30 NOTE — Discharge Instructions (Addendum)
We will contact you if the results from your lab work are positive and require additional treatment.    Make sure you are drinking plenty of fluids, especially water.   You can take Tylenol and/or Ibuprofen as needed for pain.   Follow up with GYN for re-evaluation if you continue to have abnormal vaginal bleeding.   Return or go to the Emergency Department if symptoms worsen or do not improve in the next few days.

## 2021-07-30 NOTE — ED Provider Notes (Signed)
[facility]    CSN: [id number] Arrival date & time: 07/30/21  1457      History   Chief Complaint Chief Complaint  Patient presents with   Dysuria    APPT   Abdominal Pain    HPI Faith Woods is a 25 y.o. female.   Patient here for evaluation of dysuria, lower abdominal pain, and cloudy urine for the past 4 to 5 days.  Denies any flank or lower back pain.  Denies any vaginal irritation or discharge.  Reports having irregular periods over the past month but states she also recently had her tubes tied.  Denies any trauma, injury, or other precipitating event.  Denies any specific alleviating or aggravating factors.  Denies any fevers, chest pain, shortness of breath, N/V/D, numbness, tingling, weakness, abdominal pain, or headaches.    The history is provided by the patient.  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   Increased risk for SMA, counseling done by [employer], partner to get testing done with saliva test,    H/O postpartum hemorrhage, currently pregnant 07/22/2020   History of preterm delivery, currently pregnant 11/18/2018   Preterm labor at 30 weeks Patient did not receive 17-P with second pregnancy Patient declines 17-P this pregnancy   Ischemic bowel syndrome [facility])    Medical history non-contributory    Supervision of high risk pregnancy, antepartum 04/12/2020    Nursing Staff Provider Office Location  [place] Dating  LMP Language   ENGLISH Anatomy US   normal Flu Vaccine  Declined 10/12 Genetic Screen  NIPS: low risks  AFP:   First Screen:  Quad:   TDaP vaccine   Declined 10/12 Hgb A1C or  GTT Early  Third trimester nl 2 hour Rhogam     LAB RESULTS    Blood Type O/Positive/-- (08/30 1004) O pos  Feeding Plan  BOTTLE FEED Antibody Negative (08/30 1004) Cont    Patient Active Problem List   Diagnosis Date Noted   History of bilateral tubal ligation 11/03/2020    Past Surgical History:  Procedure Laterality Date   NO PAST SURGERIES     TUBAL LIGATION N/A 11/02/2020   Procedure: POST PARTUM TUBAL LIGATION;  Surgeon: Malachy Chamber, MD;  Location: [facility];  Service: Gynecology;  Laterality: N/A;    OB History     Gravida  4   Para  4   Term  3   Preterm  1   AB      Living  4      SAB      IAB      Ectopic      Multiple  0   Live Births  4            Home Medications    Prior to Admission medications   Medication Sig Start Date End Date Taking? Authorizing Provider  ibuprofen (ADVIL) 600 MG tablet Take 1 tablet (600 mg total) by mouth every 6 (six) hours. 11/03/20   Alric Seton, MD  ibuprofen (ADVIL) 600 MG tablet Take 1 tablet (600 mg total) by mouth every 6 (six) hours as needed. 05/13/21   Lorre Nick, MD  ondansetron (ZOFRAN ODT) 4 MG disintegrating tablet Take 1 tablet (4 mg total) by  mouth every 8 (eight) hours as needed. 04/15/21   McDonald, Coral Else, PA-C    Family History Family History  Problem Relation Age of Onset   Healthy Mother    Healthy Father    Hypertension Father    Diabetes Paternal Grandfather     Social History Social History   Tobacco Use   Smoking status: Never   Smokeless tobacco: Never  Vaping Use   Vaping Use: Never used  Substance Use Topics   Alcohol use: No   Drug use: No     Allergies   Patient has no known allergies.   Review of Systems Review of Systems  Gastrointestinal:  Positive for abdominal pain.  Genitourinary:  Positive for dysuria and hematuria. Negative for vaginal bleeding, vaginal discharge and vaginal pain.  All other systems reviewed and are negative.   Physical Exam Triage Vital Signs ED Triage Vitals  Enc Vitals Group     BP      Pulse      Resp      Temp       Temp src      SpO2      Weight      Height      Head Circumference      Peak Flow      Pain Score      Pain Loc      Pain Edu?      Excl. in GC?    No data found.  Updated Vital Signs BP 102/64 (BP Location: Right Arm)   Pulse 72   Temp 98.6 F (37 C) (Oral)   Resp 17   SpO2 98%   Visual Acuity Right Eye Distance:   Left Eye Distance:   Bilateral Distance:    Right Eye Near:   Left Eye Near:    Bilateral Near:     Physical Exam Vitals and nursing note reviewed.  Constitutional:      General: She is not in acute distress.    Appearance: Normal appearance. She is not ill-appearing, toxic-appearing or diaphoretic.  HENT:     Head: Normocephalic and atraumatic.  Eyes:     Conjunctiva/sclera: Conjunctivae normal.  Cardiovascular:     Rate and Rhythm: Normal rate.     Pulses: Normal pulses.  Pulmonary:     Effort: Pulmonary effort is normal.  Abdominal:     General: Abdomen is flat.     Tenderness: There is no right CVA tenderness or left CVA tenderness.  Genitourinary:    Comments: declines Musculoskeletal:        General: Normal range of motion.     Cervical back: Normal range of motion.  Skin:    General: Skin is warm and dry.  Neurological:     General: No focal deficit present.     Mental Status: She is alert and oriented to person, place, and time.  Psychiatric:        Mood and Affect: Mood normal.     UC Treatments / Results  Labs (all labs ordered are listed, but only abnormal results are displayed) Labs Reviewed  POCT URINALYSIS DIPSTICK, ED / UC - Abnormal; Notable for the following components:      Result Value   Ketones, ur TRACE (*)    Urobilinogen, UA 4.0 (*)    All other components within normal limits  POC URINE PREG, ED  CERVICOVAGINAL ANCILLARY ONLY    EKG   Radiology No results found.  Procedures Procedures (including  critical care time)  Medications Ordered in UC Medications - No data to display  Initial Impression  / Assessment and Plan / UC Course  I have reviewed the triage vital signs and the nursing notes.  Pertinent labs & imaging results that were available during my care of the patient were reviewed by me and considered in my medical decision making (see chart for details).    Assessment negative for red flags or concerns.  Dysuria.  Urinalysis with trace ketones and urobilinogen.  No signs of infection, likely dehydration.  Encourage fluids especially water.  Self swab obtained and will treat based on results.  May take Tylenol and/or ibuprofen as needed for pain.  Abnormal vaginal bleeding.  Urine pregnancy test negative and patient has had tubal ligation.  Follow-up with GYN for reevaluation.   Final Clinical Impressions(s) / UC Diagnoses   Final diagnoses:  Dysuria  Abnormal vaginal bleeding     Discharge Instructions      We will contact you if the results from your lab work are positive and require additional treatment.    Make sure you are drinking plenty of fluids, especially water.   You can take Tylenol and/or Ibuprofen as needed for pain.   Follow up with GYN for re-evaluation if you continue to have abnormal vaginal bleeding.   Return or go to the Emergency Department if symptoms worsen or do not improve in the next few days.      ED Prescriptions   None    PDMP not reviewed this encounter.   Ivette Loyal, NP 07/30/21 1555

## 2021-07-30 NOTE — ED Triage Notes (Signed)
Pt presents with dysuria, abdominal pain, and cloudy urine xs 4-5 days.

## 2021-07-31 LAB — CERVICOVAGINAL ANCILLARY ONLY
Bacterial Vaginitis (gardnerella): POSITIVE — AB
Candida Glabrata: NEGATIVE
Candida Vaginitis: NEGATIVE
Chlamydia: NEGATIVE
Comment: NEGATIVE
Comment: NEGATIVE
Comment: NEGATIVE
Comment: NEGATIVE
Comment: NEGATIVE
Comment: NORMAL
Neisseria Gonorrhea: NEGATIVE
Trichomonas: NEGATIVE

## 2021-08-01 ENCOUNTER — Telehealth (HOSPITAL_COMMUNITY): Payer: Self-pay | Admitting: Emergency Medicine

## 2021-08-01 MED ORDER — METRONIDAZOLE 0.75 % VA GEL: 1.0000 | Freq: Every day | VAGINAL | 0 refills | Status: AC
